# Patient Record
Sex: Female | Born: 1980 | Race: Black or African American | Hispanic: No | Marital: Single | Smoking: Never smoker
Health system: Southern US, Community
[De-identification: ages and names within clinical notes are randomized; demographics above are authoritative.]

## PROBLEM LIST (undated history)

## (undated) ENCOUNTER — Inpatient Hospital Stay (HOSPITAL_COMMUNITY): Payer: Self-pay

## (undated) DIAGNOSIS — Z87442 Personal history of urinary calculi: Secondary | ICD-10-CM

## (undated) DIAGNOSIS — I10 Essential (primary) hypertension: Secondary | ICD-10-CM

## (undated) DIAGNOSIS — E282 Polycystic ovarian syndrome: Secondary | ICD-10-CM

## (undated) DIAGNOSIS — K219 Gastro-esophageal reflux disease without esophagitis: Secondary | ICD-10-CM

## (undated) DIAGNOSIS — E119 Type 2 diabetes mellitus without complications: Secondary | ICD-10-CM

## (undated) DIAGNOSIS — N200 Calculus of kidney: Secondary | ICD-10-CM

## (undated) DIAGNOSIS — G4730 Sleep apnea, unspecified: Secondary | ICD-10-CM

---

## 2000-12-18 ENCOUNTER — Other Ambulatory Visit: Admission: RE | Admit: 2000-12-18 | Discharge: 2000-12-18 | Payer: Self-pay

## 2001-01-26 ENCOUNTER — Emergency Department (HOSPITAL_COMMUNITY): Admission: EM | Admit: 2001-01-26 | Discharge: 2001-01-26 | Payer: Self-pay | Admitting: *Deleted

## 2003-02-02 ENCOUNTER — Emergency Department (HOSPITAL_COMMUNITY): Admission: EM | Admit: 2003-02-02 | Discharge: 2003-02-02 | Payer: Self-pay | Admitting: Emergency Medicine

## 2004-01-24 ENCOUNTER — Emergency Department (HOSPITAL_COMMUNITY): Admission: EM | Admit: 2004-01-24 | Discharge: 2004-01-24 | Payer: Self-pay | Admitting: Emergency Medicine

## 2004-08-17 ENCOUNTER — Emergency Department (HOSPITAL_COMMUNITY): Admission: EM | Admit: 2004-08-17 | Discharge: 2004-08-18 | Payer: Self-pay | Admitting: *Deleted

## 2005-07-14 ENCOUNTER — Emergency Department (HOSPITAL_COMMUNITY): Admission: EM | Admit: 2005-07-14 | Discharge: 2005-07-15 | Payer: Self-pay | Admitting: Emergency Medicine

## 2006-06-18 ENCOUNTER — Emergency Department (HOSPITAL_COMMUNITY): Admission: EM | Admit: 2006-06-18 | Discharge: 2006-06-18 | Payer: Self-pay | Admitting: Emergency Medicine

## 2008-05-07 ENCOUNTER — Emergency Department (HOSPITAL_COMMUNITY): Admission: EM | Admit: 2008-05-07 | Discharge: 2008-05-07 | Payer: Self-pay | Admitting: Emergency Medicine

## 2008-06-03 ENCOUNTER — Emergency Department (HOSPITAL_COMMUNITY): Admission: EM | Admit: 2008-06-03 | Discharge: 2008-06-03 | Payer: Self-pay | Admitting: Emergency Medicine

## 2008-06-29 ENCOUNTER — Emergency Department (HOSPITAL_COMMUNITY): Admission: EM | Admit: 2008-06-29 | Discharge: 2008-06-29 | Payer: Self-pay | Admitting: Emergency Medicine

## 2011-05-30 LAB — RAPID STREP SCREEN (MED CTR MEBANE ONLY): Streptococcus, Group A Screen (Direct): NEGATIVE

## 2011-05-31 LAB — DIFFERENTIAL
Basophils Absolute: 0
Basophils Relative: 1
Eosinophils Absolute: 0.1
Eosinophils Relative: 2
Lymphocytes Relative: 28
Lymphs Abs: 1.6
Monocytes Absolute: 0.8
Monocytes Relative: 14 — ABNORMAL HIGH
Neutro Abs: 3.1
Neutrophils Relative %: 56

## 2011-05-31 LAB — POCT CARDIAC MARKERS
CKMB, poc: 4.1
CKMB, poc: 4.6
Myoglobin, poc: 213
Myoglobin, poc: 215
Troponin i, poc: <0.05
Troponin i, poc: <0.05

## 2011-05-31 LAB — BASIC METABOLIC PANEL
BUN: 12
CO2: 30
Calcium: 8.8
Chloride: 103
Creatinine, Ser: 0.87
GFR calc Af Amer: >60
GFR calc non Af Amer: >60
Glucose, Bld: 92
Potassium: 4.1
Sodium: 135

## 2011-05-31 LAB — CBC
HCT: 40.5
Hemoglobin: 13.6
MCHC: 33.6
MCV: 83.1
Platelets: 289
RBC: 4.88
RDW: 13.9
WBC: 5.6

## 2014-02-09 ENCOUNTER — Emergency Department (HOSPITAL_COMMUNITY): Payer: Self-pay

## 2014-02-09 ENCOUNTER — Encounter (HOSPITAL_COMMUNITY): Payer: Self-pay | Admitting: Emergency Medicine

## 2014-02-09 ENCOUNTER — Emergency Department (HOSPITAL_COMMUNITY)
Admission: EM | Admit: 2014-02-09 | Discharge: 2014-02-09 | Disposition: A | Discharge status: Home / Self Care | Payer: Self-pay | Attending: Emergency Medicine | Admitting: Emergency Medicine

## 2014-02-09 DIAGNOSIS — Z79899 Other long term (current) drug therapy: Secondary | ICD-10-CM | POA: Insufficient documentation

## 2014-02-09 DIAGNOSIS — I10 Essential (primary) hypertension: Secondary | ICD-10-CM | POA: Insufficient documentation

## 2014-02-09 DIAGNOSIS — M65849 Other synovitis and tenosynovitis, unspecified hand: Principal | ICD-10-CM

## 2014-02-09 DIAGNOSIS — M778 Other enthesopathies, not elsewhere classified: Secondary | ICD-10-CM

## 2014-02-09 DIAGNOSIS — M779 Enthesopathy, unspecified: Secondary | ICD-10-CM

## 2014-02-09 DIAGNOSIS — M65839 Other synovitis and tenosynovitis, unspecified forearm: Secondary | ICD-10-CM | POA: Insufficient documentation

## 2014-02-09 HISTORY — DX: Essential (primary) hypertension: I10

## 2014-02-09 MED ORDER — NAPROXEN 250 MG PO TABS
500.0000 mg | ORAL_TABLET | Freq: Once | ORAL | Status: AC
  Administered 2014-02-09: 500 mg via ORAL
  Filled 2014-02-09: qty 2

## 2014-02-09 MED ORDER — NAPROXEN SODIUM 550 MG PO TABS: ORAL_TABLET | ORAL | Status: DC

## 2014-02-09 MED ORDER — HYDROCODONE-ACETAMINOPHEN 5-325 MG PO TABS: 1.0000 | ORAL_TABLET | Freq: Four times a day (QID) | ORAL | Status: DC | PRN

## 2014-02-09 NOTE — ED Notes (Signed)
Pt reporting pain in left wrist, increased with movement.  Ice pack applied.

## 2014-02-09 NOTE — ED Provider Notes (Signed)
CSN: [id number]     Arrival date & time 02/09/14  2151 History   First MD Initiated Contact with Patient 02/09/14 2331     Chief Complaint  Patient presents with  . Hand Pain     (Consider location/radiation/quality/duration/timing/severity/associated sxs/prior Treatment) HPI This is a 33 year old female with several days of pain in her left wrist proximal to the thumb. She denies injury. Pain is moderate to severe and worse with movement of the thumb or with palpation. There is no associated swelling or deformity. There is no functional deficit or numbness.  Past Medical History  Diagnosis Date  . Hypertension    History reviewed. No pertinent past surgical history. No family history on file. History  Substance Use Topics  . Smoking status: Never Smoker   . Smokeless tobacco: Not on file  . Alcohol Use: No   OB History   Grav Para Term Preterm Abortions TAB SAB Ect Mult Living                 Review of Systems  All other systems reviewed and are negative.  Allergies  Review of patient's allergies indicates no known allergies.  Home Medications   Prior to Admission medications   Medication Sig Start Date End Date Taking? Authorizing Provider  carvedilol (COREG) 25 MG tablet Take 25 mg by mouth 2 (two) times daily with a meal.   Yes Historical Provider, MD  hydrochlorothiazide (HYDRODIURIL) 25 MG tablet Take 25 mg by mouth daily.   Yes Historical Provider, MD   BP 159/92  Pulse 82  Temp(Src) 97.6 F (36.4 C) (Oral)  Resp 20  Ht 5\' 7"  (1.702 m)  Wt 356 lb (161.481 kg)  BMI 55.74 kg/m2  SpO2 98%  LMP 01/09/2014  Physical Exam General: Well-developed, well-nourished female in no acute distress; appearance consistent with age of record HENT: normocephalic; atraumatic Eyes: pupils equal, round and reactive to light; extraocular muscles intact Neck: supple Heart: regular rate and rhythm Lungs: clear to auscultation bilaterally Abdomen: soft; obese;  nontender Extremities: No deformity; full range of motion; pulses normal; tenderness of the left extensor pollicis longus at the wrist without deformity or Neurologic: Awake, alert and oriented; motor function intact in all extremities and symmetric; no facial droop Skin: Warm and dry Psychiatric: Normal mood and affect    ED Course  Procedures (including critical care time)  MDM      Hanley SeamenJohn L Nieves Barberi, MD 02/09/14 2338

## 2014-02-09 NOTE — ED Notes (Signed)
Patient c/o left hand pain x 5 days; denies injury or trauma.  Patient states has a popping behind her thumb when she moves it.

## 2014-02-09 NOTE — Discharge Instructions (Signed)
Extensor Pollicis Longus Tendinitis Extensor pollicis longus (EPL) tendonitis is a condition in which the EPL tendon lining (sheath) becomes inflamed. This causes pain on the thumb side (radial side) of the back of the wrist. The EPL tendon attaches the EPL muscle to the bone. The EPL muscle straightens the thumb. The tendon lining secretes a lubricant that allows the EPL to glide smoothly. When the lining becomes inflamed, the tendon can not glide smoothly, which causes pain. There are three grades of EPL tendonitis. A grade 1 strain is a mild strain, where the tendon experiences a slight pull, but no obvious tearing (microscopic tearing). There is no loss of strength, and the tendon is the correct length. Grade 2 strains are a moderate strain. There is tearing of tendon fibers within the tendon or where the tendon meets the bone or muscle. Tearing of the tendon causes the length of the whole muscle-tendon-bone unit to increase. A grade 2 injury usually results in decreased strength. A grade 3 strain is a complete rupture of the tendon.  CAUSES   EPL tendinitis is often an overuse injury and caused by repeated motions of the hand and wrist, due to friction of the tendon within the lining.  Sudden increase in activity or change in activity. SYMPTOMS   Vague, diffuse pain and tenderness over the thumb side of the back of the wrist.  Pain that gets worse when straightening the thumb.  Locking, catching, or triggering of the thumb joint.  Limited motion of the thumb.  Little or no swelling, warmth, or redness of the back of the wrist.  Crackling sound (crepitation) when the tendon or wrist is moved or touched. RISK INCREASES WITH:  Sports that involve repeated hand and wrist motions (tennis, golf, bowling).  Previous wrist injury.  Heavy labor.  Poor wrist strength and flexibility.  Failure to warm up properly before activity. PREVENTION   Warm up and stretch properly before  activity.  Allow time for adequate rest and recovery between practices and competition.  Maintain physical fitness:  Forearm, wrist, and hand flexibility.  Muscle strength and endurance.  Learn and use proper exercise technique. PROGNOSIS  This condition is often curable within 6 weeks, if treated properly with non-surgical treatment and resting the affected area. Surgery may be advised. RELATED COMPLICATIONS   Longer healing time, if not properly treated, or if not given adequate time to heal.  Chronic inflammation of the EPL tendon, causing pain.  Recurring of symptoms, especially if activity is resumed too soon.  Risks of surgery: infection, bleeding, injury to nerves (numbness of the thumb), continued pain, incomplete release of the tendon lining, recurring symptoms, cutting of the tendon, thumb weakness, thumb stiffness, and inability to straighten the thumb. TREATMENT  Treatment first involves ice and medicine to reduce pain and inflammation. Stretching and strengthening exercises, along with activity modification, may be advised to reduce painful symptoms. For certain cases, a brace, splint, or taping may be advised, to reduce wrist movement during activity. Your caregiver may recommend a corticosteroid injection to reduce inflammation. If non-surgical treatment is unsuccessful, surgery may be advised to release the inflamed tendon. If the tendon is torn, it may require repair or replacement (reconstruction). MEDICATION   If pain medicine is needed, nonsteroidal anti-inflammatory medicines, (aspirin and ibuprofen), or other minor pain relievers (acetaminophen), are often advised.  Do not take pain medicine for 7 days before surgery.  Prescription pain relievers are usually only prescribed after surgery. Use only as directed and only  as much as you need.  Cortisone injections reduce inflammation. However, these are used only in extreme cases, because there is a limit to the  number of times they may be given. These injections may weaken muscle and tendon tissue. Anesthetics also temporarily relieve pain. COLD THERAPY  Cold treatment (icing) relieves pain and reduces inflammation. Cold treatment should be applied for 10 to 15 minutes every 2 to 3 hours, and immediately after activity that aggravates your symptoms. Use ice packs or an ice massage. SEEK MEDICAL CARE IF:   Symptoms get worse or do not improve in 2 to 4 weeks, despite treatment.  You experience pain, numbness, or coldness in the hand.  Blue, gray, or dark color appears in the fingernails.  Any of the following occur after surgery: increased pain, swelling, redness, drainage of fluids, bleeding in the affected area, or signs of infection.  New, unexplained symptoms develop. (Drugs used in treatment may produce side effects.) Document Released: 08/14/2005 Document Revised: 11/06/2011 Document Reviewed: 11/26/2008 [facility] Patient Information 2014 [place]ExitCare, [place]LLC.

## 2014-03-02 MED FILL — Oxycodone w/ Acetaminophen Tab 5-325 MG: ORAL | Qty: 6 | Status: AC

## 2014-09-10 ENCOUNTER — Emergency Department (HOSPITAL_COMMUNITY): Payer: Self-pay

## 2014-09-10 ENCOUNTER — Other Ambulatory Visit: Payer: Self-pay

## 2014-09-10 ENCOUNTER — Encounter (HOSPITAL_COMMUNITY): Payer: Self-pay | Admitting: Emergency Medicine

## 2014-09-10 ENCOUNTER — Emergency Department (HOSPITAL_COMMUNITY)
Admission: EM | Admit: 2014-09-10 | Discharge: 2014-09-10 | Disposition: A | Discharge status: Home / Self Care | Payer: Self-pay | Attending: Emergency Medicine | Admitting: Emergency Medicine

## 2014-09-10 DIAGNOSIS — K219 Gastro-esophageal reflux disease without esophagitis: Secondary | ICD-10-CM | POA: Insufficient documentation

## 2014-09-10 DIAGNOSIS — Z79899 Other long term (current) drug therapy: Secondary | ICD-10-CM | POA: Insufficient documentation

## 2014-09-10 DIAGNOSIS — R0981 Nasal congestion: Secondary | ICD-10-CM | POA: Insufficient documentation

## 2014-09-10 DIAGNOSIS — R059 Cough, unspecified: Secondary | ICD-10-CM

## 2014-09-10 DIAGNOSIS — R05 Cough: Secondary | ICD-10-CM

## 2014-09-10 DIAGNOSIS — R079 Chest pain, unspecified: Secondary | ICD-10-CM | POA: Insufficient documentation

## 2014-09-10 DIAGNOSIS — I10 Essential (primary) hypertension: Secondary | ICD-10-CM | POA: Insufficient documentation

## 2014-09-10 MED ORDER — PANTOPRAZOLE SODIUM 40 MG PO TBEC
40.0000 mg | DELAYED_RELEASE_TABLET | Freq: Every day | ORAL | Status: DC
  Administered 2014-09-10: 40 mg via ORAL
  Filled 2014-09-10: qty 1

## 2014-09-10 MED ORDER — FAMOTIDINE 20 MG PO TABS
20.0000 mg | ORAL_TABLET | Freq: Once | ORAL | Status: AC
  Administered 2014-09-10: 20 mg via ORAL
  Filled 2014-09-10: qty 1

## 2014-09-10 MED ORDER — SUCRALFATE 1 GM/10ML PO SUSP
1.0000 g | Freq: Once | ORAL | Status: AC
  Administered 2014-09-10: 1 g via ORAL
  Filled 2014-09-10: qty 10

## 2014-09-10 NOTE — ED Notes (Addendum)
Pt reports cough x3 days. Pt reports ever since cough developed pt reports "feels like something is stuck in my throat" and chest tightness. Airway patent.pt reports tightness is worse with sitting. [facility] "[facility]."

## 2014-09-10 NOTE — ED Provider Notes (Signed)
CSN: [id number]     Arrival date & time 09/10/14  1748 History   First MD Initiated Contact with Patient 09/10/14 1825     Chief Complaint  Patient presents with  . Cough     (Consider location/radiation/quality/duration/timing/severity/associated sxs/prior Treatment) Patient is a 34 y.o. female presenting with cough. The history is provided by the patient.  Cough Cough characteristics:  Non-productive Duration:  3 days Timing:  Intermittent Progression:  Unchanged Chronicity:  New Smoker: no   Context: weather changes   Context: not exposure to allergens and not sick contacts   Relieved by:  Nothing Worsened by:  Lying down Ineffective treatments:  None tried Associated symptoms: chest pain and sinus congestion   Associated symptoms: no eye discharge, no fever, no shortness of breath and no wheezing   Risk factors: no recent travel     Past Medical History  Diagnosis Date  . Hypertension    History reviewed. No pertinent past surgical history. History reviewed. No pertinent family history. History  Substance Use Topics  . Smoking status: Never Smoker   . Smokeless tobacco: Not on file  . Alcohol Use: No   OB History    No data available     Review of Systems  Constitutional: Negative for fever and activity change.       All ROS Neg except as noted in HPI  Eyes: Negative for photophobia and discharge.  Respiratory: Positive for cough. Negative for shortness of breath and wheezing.   Cardiovascular: Positive for chest pain. Negative for palpitations.  Gastrointestinal: Negative for abdominal pain and blood in stool.  Genitourinary: Negative for dysuria, frequency and hematuria.  Musculoskeletal: Negative for back pain, arthralgias and neck pain.  Skin: Negative.   Neurological: Negative for dizziness, seizures and speech difficulty.  Psychiatric/Behavioral: Negative for hallucinations and confusion.      Allergies  Review of patient's allergies indicates no  known allergies.  Home Medications   Prior to Admission medications   Medication Sig Start Date End Date Taking? Authorizing Provider  carvedilol (COREG) 25 MG tablet Take 25 mg by mouth 2 (two) times daily with a meal.    Historical Provider, MD  hydrochlorothiazide (HYDRODIURIL) 25 MG tablet Take 25 mg by mouth daily.    Historical Provider, MD  HYDROcodone-acetaminophen (NORCO) 5-325 MG per tablet Take 1-2 tablets by mouth every 6 (six) hours as needed (for pain). 02/09/14   Carlisle Beers Molpus, MD  naproxen sodium (ANAPROX DS) 550 MG tablet Take 1 tablet twice daily as needed for hand pain. Best taken with a meal. 02/09/14   John L Molpus, MD   BP 186/99 mmHg  Pulse 110  Temp(Src) 98.2 F (36.8 C) (Oral)  Resp 18  Ht  (1.702 m)  Wt 356 lb (161.481 kg)  BMI 55.74 kg/m2  SpO2 100%  LMP 08/17/2014 Physical Exam  Constitutional: She is oriented to person, place, and time. She appears well-developed and well-nourished.  Non-toxic appearance.  HENT:  Head: Normocephalic.  Right Ear: Tympanic membrane and external ear normal.  Left Ear: Tympanic membrane and external ear normal.  Mild nasal congestion.  Eyes: EOM and lids are normal. Pupils are equal, round, and reactive to light.  Neck: Normal range of motion. Neck supple. Carotid bruit is not present.  Cardiovascular: Normal rate, regular rhythm, normal heart sounds, intact distal pulses and normal pulses.   Pulmonary/Chest: Breath sounds normal. No respiratory distress. She has no wheezes. She exhibits tenderness.  Abdominal: Soft. Bowel sounds are  normal. There is no tenderness. There is no guarding.  Musculoskeletal: Normal range of motion.  Lymphadenopathy:       Head (right side): No submandibular adenopathy present.       Head (left side): No submandibular adenopathy present.    She has no cervical adenopathy.  Neurological: She is alert and oriented to person, place, and time. She has normal strength. No cranial nerve deficit  or sensory deficit.  Skin: Skin is warm and dry.  Psychiatric: She has a normal mood and affect. Her speech is normal.  Nursing note and vitals reviewed.   ED Course  Procedures (including critical care time) Labs Review Labs Reviewed - No data to display  Imaging Review No results found.   EKG Interpretation None      MDM ` Chest xray is negative for acute changes.   Pt speaking in complete sentences. Suspect pt has cough due to nasal congestion, and reflux. Pt to use nexium, pepcid, for discomfort. Pt to see GI or return to the ED if any chages or problem.    Final diagnoses:  Cough    **I have reviewed nursing notes, vital signs, and all appropriate lab and imaging results for this patient.Kathie Dike*    Shakela Donati M Antavious Spanos, PA-C 09/11/14 Jacinta [id number]

## 2014-09-10 NOTE — Discharge Instructions (Signed)
Your chest x-ray and electrocardiogram are well within normal limits. Your examination reveals some mild congestion nasally. Your history suggest gastroesophageal reflux disease. Please elevate the head of your bed on 6 inch blocks. Please do not eat or drink after 9:00 night. Please reduce if not eliminate spicy and fried foods. Please use Nexium (over-the-counter) daily, use Pepcid 20 mg daily. Please use Afrin spray every 8 hours for the next 5 days only for nasal congestion. Please see your primary physician, or return to the emergency department if any changes, problems, or concerns. Gastroesophageal Reflux Disease, Adult Gastroesophageal reflux disease (GERD) happens when acid from your stomach flows up into the esophagus. When acid comes in contact with the esophagus, the acid causes soreness (inflammation) in the esophagus. Over time, GERD may create small holes (ulcers) in the lining of the esophagus. CAUSES   Increased body weight. This puts pressure on the stomach, making acid rise from the stomach into the esophagus.  Smoking. This increases acid production in the stomach.  Drinking alcohol. This causes decreased pressure in the lower esophageal sphincter (valve or ring of muscle between the esophagus and stomach), allowing acid from the stomach into the esophagus.  Late evening meals and a full stomach. This increases pressure and acid production in the stomach.  A malformed lower esophageal sphincter. Sometimes, no cause is found. SYMPTOMS   Burning pain in the lower part of the mid-chest behind the breastbone and in the mid-stomach area. This may occur twice a week or more often.  Trouble swallowing.  Sore throat.  Dry cough.  Asthma-like symptoms including chest tightness, shortness of breath, or wheezing. DIAGNOSIS  Your caregiver may be able to diagnose GERD based on your symptoms. In some cases, X-rays and other tests may be done to check for complications or to check  the condition of your stomach and esophagus. TREATMENT  Your caregiver may recommend over-the-counter or prescription medicines to help decrease acid production. Ask your caregiver before starting or adding any new medicines.  HOME CARE INSTRUCTIONS   Change the factors that you can control. Ask your caregiver for guidance concerning weight loss, quitting smoking, and alcohol consumption.  Avoid foods and drinks that make your symptoms worse, such as:  Caffeine or alcoholic drinks.  Chocolate.  Peppermint or mint flavorings.  Garlic and onions.  Spicy foods.  Citrus fruits, such as oranges, lemons, or limes.  Tomato-based foods such as sauce, chili, salsa, and pizza.  Fried and fatty foods.  Avoid lying down for the 3 hours prior to your bedtime or prior to taking a nap.  Eat small, frequent meals instead of large meals.  Wear loose-fitting clothing. Do not wear anything tight around your waist that causes pressure on your stomach.  Raise the head of your bed 6 to 8 inches with wood blocks to help you sleep. Extra pillows will not help.  Only take over-the-counter or prescription medicines for pain, discomfort, or fever as directed by your caregiver.  Do not take aspirin, ibuprofen, or other nonsteroidal anti-inflammatory drugs (NSAIDs). SEEK IMMEDIATE MEDICAL CARE IF:   You have pain in your arms, neck, jaw, teeth, or back.  Your pain increases or changes in intensity or duration.  You develop nausea, vomiting, or sweating (diaphoresis).  You develop shortness of breath, or you faint.  Your vomit is green, yellow, black, or looks like coffee grounds or blood.  Your stool is red, bloody, or black. These symptoms could be signs of other problems, such as  heart disease, gastric bleeding, or esophageal bleeding. MAKE SURE YOU:   Understand these instructions.  Will watch your condition.  Will get help right away if you are not doing well or get worse. Document  Released: 05/24/2005 Document Revised: 11/06/2011 Document Reviewed: 03/03/2011 [facility] Patient Information 2015 [place], [place]LLC. This information is not intended to replace advice given to you by your health care provider. Make sure you discuss any questions you have with your health care provider.

## 2014-09-13 ENCOUNTER — Emergency Department (HOSPITAL_COMMUNITY)
Admission: EM | Admit: 2014-09-13 | Discharge: 2014-09-13 | Disposition: A | Discharge status: Home / Self Care | Payer: Self-pay | Attending: Emergency Medicine | Admitting: Emergency Medicine

## 2014-09-13 ENCOUNTER — Encounter (HOSPITAL_COMMUNITY): Payer: Self-pay | Admitting: Emergency Medicine

## 2014-09-13 DIAGNOSIS — Z7952 Long term (current) use of systemic steroids: Secondary | ICD-10-CM | POA: Insufficient documentation

## 2014-09-13 DIAGNOSIS — Z79899 Other long term (current) drug therapy: Secondary | ICD-10-CM | POA: Insufficient documentation

## 2014-09-13 DIAGNOSIS — I10 Essential (primary) hypertension: Secondary | ICD-10-CM | POA: Insufficient documentation

## 2014-09-13 DIAGNOSIS — J019 Acute sinusitis, unspecified: Secondary | ICD-10-CM | POA: Insufficient documentation

## 2014-09-13 DIAGNOSIS — R059 Cough, unspecified: Secondary | ICD-10-CM

## 2014-09-13 DIAGNOSIS — Z791 Long term (current) use of non-steroidal anti-inflammatories (NSAID): Secondary | ICD-10-CM | POA: Insufficient documentation

## 2014-09-13 DIAGNOSIS — R05 Cough: Secondary | ICD-10-CM

## 2014-09-13 MED ORDER — GUAIFENESIN-CODEINE 100-10 MG/5ML PO SOLN
10.0000 mL | Freq: Once | ORAL | Status: AC
  Administered 2014-09-13: 10 mL via ORAL
  Filled 2014-09-13: qty 10

## 2014-09-13 MED ORDER — PROMETHAZINE-DM 6.25-15 MG/5ML PO SYRP: 5.0000 mL | ORAL_SOLUTION | ORAL | Status: DC

## 2014-09-13 MED ORDER — ALBUTEROL SULFATE HFA 108 (90 BASE) MCG/ACT IN AERS
2.0000 | INHALATION_SPRAY | Freq: Once | RESPIRATORY_TRACT | Status: AC
  Administered 2014-09-13: 2 via RESPIRATORY_TRACT
  Filled 2014-09-13: qty 6.7

## 2014-09-13 NOTE — ED Provider Notes (Signed)
CSN: [id number]     Arrival date & time 09/13/14  1851 History  This chart was scribed for a non-physician practitioner, Pauline Ausammy Nillie Bartolotta, PA-C working with Vanetta MuldersScott Zackowski, MD by [place] Peace, ED Scribe. The patient was seen in [facility]. The patient's care was started at 7:06 PM.    Chief Complaint  Patient presents with  . Cough      Patient is a 34 y.o. female presenting with cough. The history is provided by the patient. No language interpreter was used.  Cough Cough characteristics:  Productive Severity:  Severe Duration:  1 week Timing:  Intermittent Progression:  Unchanged Chronicity:  New Smoker: no   Associated symptoms: no chest pain, no chills, no ear pain, no fever, no headaches, no rash, no shortness of breath, no sore throat and no wheezing   HPI Comments: Tammy Taylor is a 34 y.o. female who presents to the Emergency Department complaining of intermittent productive cough, sinus pressure and nasal congestion onset 1 week ago. Pt states she feels like her sinuses are "messed up". Cough worse at night when she is laying down. Pt was seen 3 days ago here at ED for same issue and states the medications given have not helped her symptoms. [facility] by a physician that prescribed her prednisone. She goes on to explain that she stopped taking the prescription after being advised to do so by the previous visit here. No complaints of fever, shortness of breath, abd pain, wheezing or chest pain. States she has been taking OTC sudafed with slight relief of nasal congestion  Past Medical History  Diagnosis Date  . Hypertension    History reviewed. No pertinent past surgical history. History reviewed. No pertinent family history. History  Substance Use Topics  . Smoking status: Never Smoker   . Smokeless tobacco: Not on file  . Alcohol Use: No   OB History    No data available     Review of Systems  Constitutional:  Negative for fever, chills and appetite change.  HENT: Positive for congestion and sinus pressure. Negative for ear pain, facial swelling, sore throat and trouble swallowing.   Respiratory: Positive for cough. Negative for chest tightness, shortness of breath and wheezing.   Cardiovascular: Negative for chest pain.  Gastrointestinal: Negative for vomiting, abdominal pain and diarrhea.  Genitourinary: Negative for dysuria and flank pain.  Musculoskeletal: Negative for arthralgias, neck pain and neck stiffness.  Skin: Negative for rash.  Neurological: Negative for dizziness, syncope, facial asymmetry, weakness, numbness and headaches.  Hematological: Negative for adenopathy.  All other systems reviewed and are negative.     Allergies  Review of patient's allergies indicates no known allergies.  Home Medications   Prior to Admission medications   Medication Sig Start Date End Date Taking? Authorizing Provider  HYDROcodone-acetaminophen (NORCO) 5-325 MG per tablet Take 1-2 tablets by mouth every 6 (six) hours as needed (for pain). Patient not taking: Reported on 09/10/2014 02/09/14   Carlisle BeersJohn L Molpus, MD  lisinopril (PRINIVIL,ZESTRIL) 10 MG tablet Take 10 mg by mouth daily.    Historical Provider, MD  naproxen sodium (ANAPROX DS) 550 MG tablet Take 1 tablet twice daily as needed for hand pain. Best taken with a meal. Patient not taking: Reported on 09/10/2014 02/09/14   Carlisle BeersJohn L Molpus, MD  predniSONE (DELTASONE) 20 MG tablet Take 60 mg by mouth daily with breakfast. 5 day course starting on 09/10/2014    Historical Provider, MD  BP 123/56 mmHg  Pulse 80  Temp(Src) 98.1 F (36.7 C) (Oral)  Resp 20  Ht  (1.702 m)  Wt 356 lb (161.481 kg)  BMI 55.74 kg/m2  SpO2 100%  LMP 08/17/2014 Physical Exam  Constitutional: She is oriented to person, place, and time. She appears well-developed and well-nourished. No distress.  HENT:  Head: Normocephalic and atraumatic.  Right Ear: Tympanic  membrane and ear canal normal.  Left Ear: Tympanic membrane normal.  Nose: Mucosal edema and rhinorrhea present. Right sinus exhibits maxillary sinus tenderness. Left sinus exhibits maxillary sinus tenderness.  Mouth/Throat: Uvula is midline and mucous membranes are normal. Mucous membranes are not dry. Posterior oropharyngeal erythema present. No oropharyngeal exudate or posterior oropharyngeal edema.  Bilateral nasal edema.   Eyes: Conjunctivae and EOM are normal.  Neck: Normal range of motion. Neck supple.  Cardiovascular: Normal rate, regular rhythm, normal heart sounds and intact distal pulses.   No murmur heard. Pulmonary/Chest: Effort normal and breath sounds normal. No respiratory distress. She has no wheezes. She has no rales. She exhibits no tenderness.  Musculoskeletal: Normal range of motion.  Lymphadenopathy:    She has no cervical adenopathy.  Neurological: She is alert and oriented to person, place, and time. She exhibits normal muscle tone. Coordination normal.  Skin: Skin is warm and dry.  Psychiatric: She has a normal mood and affect. Her behavior is normal.  Nursing note and vitals reviewed.   ED Course  Procedures (including critical care time) Labs Review Labs Reviewed - No data to display  Imaging Review No results found.   EKG Interpretation None     Medications - No data to display  7:10 PM- Treatment plan was discussed with patient who verbalizes understanding and agrees.    Reviewed CXR from 08/31/14 that showed no acute CP process MDM   Final diagnoses:  Cough  Acute sinusitis, recurrence not specified, unspecified location    Pt is well appearing, VSS.  No hypoxia, tachycardia or tachypnea.  Sx's likely related to viral process.  I have advised pt to use OTC afrin nasal spray x 3 days then d/c.  Albuterol inhaler dispensed and prescribed phenergan/DM cough syrup.  Pt reported taking OTC sudafed and has hx of HTN, so I have advised her to d/c use.     I personally performed the services described in this documentation, which was scribed in my presence. The recorded information has been reviewed and is accurate.   Aldrin Engelhard L. Trisha Mangle, PA-C 09/13/14 2205  Vanetta Mulders, MD 09/16/14 [phone]

## 2014-09-13 NOTE — ED Notes (Signed)
Pt reports nasal congestion,cough x1 week. nad noted.

## 2014-09-13 NOTE — Discharge Instructions (Signed)
Cough, Adult   A cough is a reflex. It helps you clear your throat and airways. A cough can help heal your body. A cough can last 2 or 3 weeks (acute) or may last more than 8 weeks (chronic). Some common causes of a cough can include an infection, allergy, or a cold.  HOME CARE  · Only take medicine as told by your doctor.  · If given, take your medicines (antibiotics) as told. Finish them even if you start to feel better.  · Use a cold steam vaporizer or humidifier in your home. This can help loosen thick spit (secretions).  · Sleep so you are almost sitting up (semi-upright). Use pillows to do this. This helps reduce coughing.  · Rest as needed.  · Stop smoking if you smoke.  GET HELP RIGHT AWAY IF:  · You have yellowish-white fluid (pus) in your thick spit.  · Your cough gets worse.  · Your medicine does not reduce coughing, and you are losing sleep.  · You cough up blood.  · You have trouble breathing.  · Your pain gets worse and medicine does not help.  · You have a fever.  MAKE SURE YOU:   · Understand these instructions.  · Will watch your condition.  · Will get help right away if you are not doing well or get worse.  Document Released: 04/27/2011 Document Revised: 12/29/2013 Document Reviewed: 04/27/2011  Exit[employer]® Patient Information ©2015 [employer]. This information is not intended to replace advice given to you by your health care provider. Make sure you discuss any questions you have with your health care provider.

## 2016-05-29 ENCOUNTER — Ambulatory Visit: Payer: Self-pay | Admitting: Family Medicine

## 2016-06-05 ENCOUNTER — Encounter: Payer: Self-pay | Admitting: Family Medicine

## 2017-05-13 ENCOUNTER — Inpatient Hospital Stay (HOSPITAL_COMMUNITY): Payer: Medicaid Other

## 2017-05-13 ENCOUNTER — Encounter (HOSPITAL_COMMUNITY): Payer: Self-pay | Admitting: *Deleted

## 2017-05-13 ENCOUNTER — Inpatient Hospital Stay (HOSPITAL_COMMUNITY)
Admission: AD | Admit: 2017-05-13 | Discharge: 2017-05-13 | Disposition: A | Discharge status: Home / Self Care | Payer: Medicaid Other | Source: Ambulatory Visit | Attending: Obstetrics & Gynecology | Admitting: Obstetrics & Gynecology

## 2017-05-13 DIAGNOSIS — O99211 Obesity complicating pregnancy, first trimester: Secondary | ICD-10-CM | POA: Insufficient documentation

## 2017-05-13 DIAGNOSIS — Z79899 Other long term (current) drug therapy: Secondary | ICD-10-CM | POA: Insufficient documentation

## 2017-05-13 DIAGNOSIS — Z87442 Personal history of urinary calculi: Secondary | ICD-10-CM | POA: Diagnosis not present

## 2017-05-13 DIAGNOSIS — O26891 Other specified pregnancy related conditions, first trimester: Secondary | ICD-10-CM | POA: Diagnosis not present

## 2017-05-13 DIAGNOSIS — E282 Polycystic ovarian syndrome: Secondary | ICD-10-CM | POA: Insufficient documentation

## 2017-05-13 DIAGNOSIS — O09521 Supervision of elderly multigravida, first trimester: Secondary | ICD-10-CM

## 2017-05-13 DIAGNOSIS — Z809 Family history of malignant neoplasm, unspecified: Secondary | ICD-10-CM | POA: Insufficient documentation

## 2017-05-13 DIAGNOSIS — O219 Vomiting of pregnancy, unspecified: Secondary | ICD-10-CM | POA: Insufficient documentation

## 2017-05-13 DIAGNOSIS — O99281 Endocrine, nutritional and metabolic diseases complicating pregnancy, first trimester: Secondary | ICD-10-CM | POA: Insufficient documentation

## 2017-05-13 DIAGNOSIS — O26899 Other specified pregnancy related conditions, unspecified trimester: Secondary | ICD-10-CM

## 2017-05-13 DIAGNOSIS — Z833 Family history of diabetes mellitus: Secondary | ICD-10-CM | POA: Diagnosis not present

## 2017-05-13 DIAGNOSIS — O10011 Pre-existing essential hypertension complicating pregnancy, first trimester: Secondary | ICD-10-CM | POA: Diagnosis present

## 2017-05-13 DIAGNOSIS — Z3A01 Less than 8 weeks gestation of pregnancy: Secondary | ICD-10-CM | POA: Insufficient documentation

## 2017-05-13 DIAGNOSIS — Z3A09 9 weeks gestation of pregnancy: Secondary | ICD-10-CM

## 2017-05-13 DIAGNOSIS — O161 Unspecified maternal hypertension, first trimester: Secondary | ICD-10-CM | POA: Diagnosis not present

## 2017-05-13 DIAGNOSIS — R109 Unspecified abdominal pain: Secondary | ICD-10-CM

## 2017-05-13 HISTORY — DX: Polycystic ovarian syndrome: E28.2

## 2017-05-13 HISTORY — DX: Calculus of kidney: N20.0

## 2017-05-13 LAB — URINALYSIS, ROUTINE W REFLEX MICROSCOPIC
BILIRUBIN URINE: NEGATIVE
Glucose, UA: NEGATIVE mg/dL
HGB URINE DIPSTICK: NEGATIVE
Ketones, ur: NEGATIVE mg/dL
Leukocytes, UA: NEGATIVE
Nitrite: NEGATIVE
Protein, ur: NEGATIVE mg/dL
Specific Gravity, Urine: 1.016 (ref 1.005–1.030)
pH: 6 (ref 5.0–8.0)

## 2017-05-13 LAB — CBC
HEMATOCRIT: 37.8 % (ref 36.0–46.0)
Hemoglobin: 12.4 g/dL (ref 12.0–15.0)
MCH: 25.4 pg — ABNORMAL LOW (ref 26.0–34.0)
MCHC: 32.8 g/dL (ref 30.0–36.0)
MCV: 77.5 fL — ABNORMAL LOW (ref 78.0–100.0)
Platelets: 294 10*3/uL (ref 150–400)
RBC: 4.88 MIL/uL (ref 3.87–5.11)
RDW: 15 % (ref 11.5–15.5)
WBC: 7.5 10*3/uL (ref 4.0–10.5)

## 2017-05-13 LAB — POCT PREGNANCY, URINE: Preg Test, Ur: POSITIVE — AB

## 2017-05-13 LAB — HCG, QUANTITATIVE, PREGNANCY: hCG, Beta Chain, Quant, S: 32375 m[IU]/mL — ABNORMAL HIGH (ref <5)

## 2017-05-13 MED ORDER — LABETALOL HCL 100 MG PO TABS: 200.0000 mg | ORAL_TABLET | Freq: Two times a day (BID) | ORAL | Status: DC

## 2017-05-13 MED ORDER — PRENATAL PLUS 27-1 MG PO TABS: 1.0000 | ORAL_TABLET | Freq: Every day | ORAL | 0 refills | Status: DC

## 2017-05-13 MED ORDER — LABETALOL HCL 200 MG PO TABS: 200.0000 mg | ORAL_TABLET | Freq: Two times a day (BID) | ORAL | 2 refills | Status: DC

## 2017-05-13 NOTE — MAU Note (Addendum)
Took 2 preg tests on Friday, both were positive. This will be her first preg.  Has been severely constipated (small this morning, prior was 3 or 4 days).  Having some pain in RLQ started yesterday.  Spotted a wk ago

## 2017-05-13 NOTE — MAU Provider Note (Signed)
History     CSN: [id number]  Arrival date and time: 05/13/17 1243   First Provider Initiated Contact with Patient 05/13/17 1457      Chief Complaint  Patient presents with  . Abdominal Pain  . Possible Pregnancy   Tammy Taylor is a 36 y.o. G1P0 with CHTN and no PNC at [redacted]w[redacted]d by LMP presenting for pregnancy confirmation and reporting intermittent mild pinching right lower quadrant and right groin pain for the past few days. She had a negative HPT 1 month ago and her first positive was 2 days ago. She's had nausea and occasional vomiting for 1 week. Pregnancy is desired.   NPC  Past Medical History:  Diagnosis Date  . Hypertension   . Kidney stones   . PCOS (polycystic ovarian syndrome)     History reviewed. No pertinent surgical history.  Family History  Problem Relation Age of Onset  . Diabetes Father   . Cancer Paternal Aunt     Social History  Substance Use Topics  . Smoking status: Never Smoker  . Smokeless tobacco: Never Used  . Alcohol use No    Allergies: No Known Allergies  Prescriptions Prior to Admission  Medication Sig Dispense Refill Last Dose  . carvedilol (COREG) 3.125 MG tablet Take 3.125 mg by mouth 2 (two) times daily with a meal.   05/12/2017 at Unknown time  . chlorthalidone (HYGROTON) 25 MG tablet Take 25 mg by mouth daily.   05/12/2017 at Unknown time    Review of Systems  Constitutional: Negative for activity change, appetite change, fatigue and fever.  Respiratory: Negative for chest tightness.   Cardiovascular: Negative for chest pain and leg swelling.  Gastrointestinal: Positive for abdominal pain.       Indicates diffuse right lower quadrant and right groin as area of intermittent pain  Genitourinary: Positive for frequency and vaginal bleeding. Negative for dysuria, hematuria, pelvic pain and urgency.        Gantz spot of pink blood on 05/07/2017, no other bleeding episode  Neurological: Negative for dizziness.   Psychiatric/Behavioral: Negative for agitation. The patient is not nervous/anxious.    Physical Exam   Blood pressure (!) 187/97, pulse 79, temperature 98 F (36.7 C), temperature source Oral, resp. rate 18, weight (!) 341 lb 12 oz (155 kg), last menstrual period 03/06/2017, SpO2 99 %.  Physical Exam  Nursing note and vitals reviewed. Constitutional: She is oriented to person, place, and time. She appears well-developed.  HENT:  Head: Normocephalic.  Eyes: Pupils are equal, round, and reactive to light.  Neck: Normal range of motion.  Cardiovascular: Normal rate.   Respiratory: Effort normal.  GI: Soft. There is no tenderness.  Musculoskeletal: Normal range of motion.  Neurological: She is alert and oriented to person, place, and time.  Skin: Skin is warm.    MAU Course  Procedures  Bedside US by me: unable to see cardiac activity US Ob Comp Less 14 Wks  Result Date: 05/13/2017 CLINICAL DATA:  Right lower quadrant abdominal pain. Pregnant patient. EXAM: OBSTETRIC <14 WK [place] AND TRANSVAGINAL OB US TECHNIQUE: Both transabdominal and transvaginal ultrasound examinations were performed for complete evaluation of the gestation as well as the maternal uterus, adnexal regions, and pelvic cul-de-sac. Transvaginal technique was performed to assess early pregnancy. COMPARISON:  None. FINDINGS: Intrauterine gestational sac: Single Yolk sac:  Visualized. Embryo:  Visualized. Cardiac Activity: Visualized. Heart Rate: 114 bpm MSD:   mm    w     d CRL:  5  mm   6 w   1 d                  [place] [facility]: Jan 05, 2018 Subchorionic hemorrhage:  None visualized. Maternal uterus/adnexae: Normal in appearance. Trace fluid in the pelvis is likely physiologic. IMPRESSION: 1. Single live IUP.  Trace physiologic fluid in the pelvis. Electronically Signed   By: Gerome Sam III M.D   On: 05/13/2017 15:56   US Ob Transvaginal  Result Date: 05/13/2017 CLINICAL DATA:  Right lower quadrant abdominal pain. Pregnant  patient. EXAM: OBSTETRIC <14 WK [place] AND TRANSVAGINAL OB US TECHNIQUE: Both transabdominal and transvaginal ultrasound examinations were performed for complete evaluation of the gestation as well as the maternal uterus, adnexal regions, and pelvic cul-de-sac. Transvaginal technique was performed to assess early pregnancy. COMPARISON:  None. FINDINGS: Intrauterine gestational sac: Single Yolk sac:  Visualized. Embryo:  Visualized. Cardiac Activity: Visualized. Heart Rate: 114 bpm MSD:   mm    w     d CRL:  5  mm   6 w   1 d                  [place] [facility]: Jan 05, 2018 Subchorionic hemorrhage:  None visualized. Maternal uterus/adnexae: Normal in appearance. Trace fluid in the pelvis is likely physiologic. IMPRESSION: 1. Single live IUP.  Trace physiologic fluid in the pelvis. Electronically Signed   By: Gerome Sam III M.D   On: 05/13/2017 15:56   C/W Dr. Erin Fulling will start labetalol 400 mg by mouth twice a day and stop her other antihypertensives   Assessment and Plan   1. Abdominal pain during pregnancy   2. Morbid obesity (HCC)   3. Essential hypertension affecting pregnancy in first trimester   4. AMA (advanced maternal age) multigravida 35+, first trimester    Allergies as of 05/13/2017   No Known Allergies     Medication List    STOP taking these medications   carvedilol 3.125 MG tablet Commonly known as:  COREG   chlorthalidone 25 MG tablet Commonly known as:  HYGROTON     TAKE these medications   labetalol 200 MG tablet Commonly known as:  NORMODYNE Take 1 tablet (200 mg total) by mouth 2 (two) times daily.   prenatal vitamin w/FE, FA 27-1 MG Tabs tablet Take 1 tablet by mouth daily.            Discharge Care Instructions        Start     Ordered   05/13/17 0000  prenatal vitamin w/FE, FA (PRENATAL 1 + 1) 27-1 MG TABS tablet  Daily    Question:  Supervising Provider  Answer:  Willodean Rosenthal   05/13/17 1641   05/13/17 0000  labetalol (NORMODYNE) 200 MG  tablet  2 times daily    Question:  Supervising Provider  Answer:  Willodean Rosenthal   05/13/17 1643    ; Follow-up Information    [facility]. Schedule an appointment as soon as possible for a visit in 2 week(s).   Contact information: [address] [place] [address] [phone]          Adlai Nieblas CNM 05/13/2017, 3:00 PM

## 2017-05-13 NOTE — MAU Note (Signed)
Patient declines cultures at this time. CNM in department and made aware.

## 2017-05-13 NOTE — MAU Note (Addendum)
Has not taken BP or "fluid pill"  today; last took yesterday.

## 2017-05-13 NOTE — Discharge Instructions (Signed)
First Trimester of Pregnancy The first trimester of pregnancy is from week 1 until the end of week 13 (months 1 through 3). A week after a sperm fertilizes an egg, the egg will implant on the wall of the uterus. This embryo will begin to develop into a baby. Genes from you and your partner will form the baby. The female genes will determine whether the baby will be a boy or a girl. At 6-8 weeks, the eyes and face will be formed, and the heartbeat can be seen on ultrasound. At the end of 12 weeks, all the baby's organs will be formed. Now that you are pregnant, you will want to do everything you can to have a healthy baby. Two of the most important things are to get good prenatal care and to follow your health care provider's instructions. Prenatal care is all the medical care you receive before the baby's birth. This care will help prevent, find, and treat any problems during the pregnancy and childbirth. Body changes during your first trimester Your body goes through many changes during pregnancy. The changes vary from woman to woman.  You may gain or lose a couple of pounds at first.  You may feel sick to your stomach (nauseous) and you may throw up (vomit). If the vomiting is uncontrollable, call your health care provider.  You may tire easily.  You may develop headaches that can be relieved by medicines. All medicines should be approved by your health care provider.  You may urinate more often. Painful urination may mean you have a bladder infection.  You may develop heartburn as a result of your pregnancy.  You may develop constipation because certain hormones are causing the muscles that push stool through your intestines to slow down.  You may develop hemorrhoids or swollen veins (varicose veins).  Your breasts may begin to grow larger and become tender. Your nipples may stick out more, and the tissue that surrounds them (areola) may become darker.  Your gums may bleed and may be  sensitive to brushing and flossing.  Dark spots or blotches (chloasma, mask of pregnancy) may develop on your face. This will likely fade after the baby is born.  Your menstrual periods will stop.  You may have a loss of appetite.  You may develop cravings for certain kinds of food.  You may have changes in your emotions from day to day, such as being excited to be pregnant or being concerned that something may go wrong with the pregnancy and baby.  You may have more vivid and strange dreams.  You may have changes in your hair. These can include thickening of your hair, rapid growth, and changes in texture. Some women also have hair loss during or after pregnancy, or hair that feels dry or thin. Your hair will most likely return to normal after your baby is born.  What to expect at prenatal visits During a routine prenatal visit:  You will be weighed to make sure you and the baby are growing normally.  Your blood pressure will be taken.  Your abdomen will be measured to track your baby's growth.  The fetal heartbeat will be listened to between weeks 10 and 14 of your pregnancy.  Test results from any previous visits will be discussed.  Your health care provider may ask you:  How you are feeling.  If you are feeling the baby move.  If you have had any abnormal symptoms, such as leaking fluid, bleeding, severe headaches,   or abdominal cramping.  If you are using any tobacco products, including cigarettes, chewing tobacco, and electronic cigarettes.  If you have any questions.  Other tests that may be performed during your first trimester include:  Blood tests to find your blood type and to check for the presence of any previous infections. The tests will also be used to check for low iron levels (anemia) and protein on red blood cells (Rh antibodies). Depending on your risk factors, or if you previously had diabetes during pregnancy, you may have tests to check for high blood  sugar that affects pregnant women (gestational diabetes).  Urine tests to check for infections, diabetes, or protein in the urine.  An ultrasound to confirm the proper growth and development of the baby.  Fetal screens for spinal cord problems (spina bifida) and Down syndrome.  HIV (human immunodeficiency virus) testing. Routine prenatal testing includes screening for HIV, unless you choose not to have this test.  You may need other tests to make sure you and the baby are doing well.  Follow these instructions at home: Medicines  Follow your health care provider's instructions regarding medicine use. Specific medicines may be either safe or unsafe to take during pregnancy.  Take a prenatal vitamin that contains at least 600 micrograms (mcg) of folic acid.  If you develop constipation, try taking a stool softener if your health care provider approves. Eating and drinking  Eat a balanced diet that includes fresh fruits and vegetables, whole grains, good sources of protein such as meat, eggs, or tofu, and low-fat dairy. Your health care provider will help you determine the amount of weight gain that is right for you.  Avoid raw meat and uncooked cheese. These carry germs that can cause birth defects in the baby.  Eating four or five small meals rather than three large meals a day may help relieve nausea and vomiting. If you start to feel nauseous, eating a few soda crackers can be helpful. Drinking liquids between meals, instead of during meals, also seems to help ease nausea and vomiting.  Limit foods that are high in fat and processed sugars, such as fried and sweet foods.  To prevent constipation: ? Eat foods that are high in fiber, such as fresh fruits and vegetables, whole grains, and beans. ? Drink enough fluid to keep your urine clear or pale yellow. Activity  Exercise only as directed by your health care provider. Most women can continue their usual exercise routine during  pregnancy. Try to exercise for 30 minutes at least 5 days a week. Exercising will help you: ? Control your weight. ? Stay in shape. ? Be prepared for labor and delivery.  Experiencing pain or cramping in the lower abdomen or lower back is a good sign that you should stop exercising. Check with your health care provider before continuing with normal exercises.  Try to avoid standing for long periods of time. Move your legs often if you must stand in one place for a long time.  Avoid heavy lifting.  Wear low-heeled shoes and practice good posture.  You may continue to have sex unless your health care provider tells you not to. Relieving pain and discomfort  Wear a good support bra to relieve breast tenderness.  Take warm sitz baths to soothe any pain or discomfort caused by hemorrhoids. Use hemorrhoid cream if your health care provider approves.  Rest with your legs elevated if you have leg cramps or low back pain.  If you develop   varicose veins in your legs, wear support hose. Elevate your feet for 15 minutes, 3-4 times a day. Limit salt in your diet. Prenatal care  Schedule your prenatal visits by the twelfth week of pregnancy. They are usually scheduled monthly at first, then more often in the last 2 months before delivery.  Write down your questions. Take them to your prenatal visits.  Keep all your prenatal visits as told by your health care provider. This is important. Safety  Wear your seat belt at all times when driving.  Make a list of emergency phone numbers, including numbers for family, friends, the hospital, and police and fire departments. General instructions  Ask your health care provider for a referral to a local prenatal education class. Begin classes no later than the beginning of month 6 of your pregnancy.  Ask for help if you have counseling or nutritional needs during pregnancy. Your health care provider can offer advice or refer you to specialists for help  with various needs.  Do not use hot tubs, steam rooms, or saunas.  Do not douche or use tampons or scented sanitary pads.  Do not cross your legs for long periods of time.  Avoid cat litter boxes and soil used by cats. These carry germs that can cause birth defects in the baby and possibly loss of the fetus by miscarriage or stillbirth.  Avoid all smoking, herbs, alcohol, and medicines not prescribed by your health care provider. Chemicals in these products affect the formation and growth of the baby.  Do not use any products that contain nicotine or tobacco, such as cigarettes and e-cigarettes. If you need help quitting, ask your health care provider. You may receive counseling support and other resources to help you quit.  Schedule a dentist appointment. At home, brush your teeth with a soft toothbrush and be gentle when you floss. Contact a health care provider if:  You have dizziness.  You have mild pelvic cramps, pelvic pressure, or nagging pain in the abdominal area.  You have persistent nausea, vomiting, or diarrhea.  You have a bad smelling vaginal discharge.  You have pain when you urinate.  You notice increased swelling in your face, hands, legs, or ankles.  You are exposed to fifth disease or chickenpox.  You are exposed to German measles (rubella) and have never had it. Get help right away if:  You have a fever.  You are leaking fluid from your vagina.  You have spotting or bleeding from your vagina.  You have severe abdominal cramping or pain.  You have rapid weight gain or loss.  You vomit blood or material that looks like coffee grounds.  You develop a severe headache.  You have shortness of breath.  You have any kind of trauma, such as from a fall or a car accident. Summary  The first trimester of pregnancy is from week 1 until the end of week 13 (months 1 through 3).  Your body goes through many changes during pregnancy. The changes vary from  woman to woman.  You will have routine prenatal visits. During those visits, your health care provider will examine you, discuss any test results you may have, and talk with you about how you are feeling. This information is not intended to replace advice given to you by your health care provider. Make sure you discuss any questions you have with your health care provider. Document Released: 08/08/2001 Document Revised: 07/26/2016 Document Reviewed: 07/26/2016 [employer] Interactive Patient Education  2017 [employer]  

## 2017-07-25 ENCOUNTER — Other Ambulatory Visit (HOSPITAL_COMMUNITY): Payer: Self-pay | Admitting: Unknown Physician Specialty

## 2017-07-26 ENCOUNTER — Encounter (HOSPITAL_COMMUNITY): Payer: Self-pay | Admitting: Unknown Physician Specialty

## 2017-08-02 ENCOUNTER — Other Ambulatory Visit (HOSPITAL_COMMUNITY): Payer: Self-pay | Admitting: *Deleted

## 2017-08-02 ENCOUNTER — Encounter (HOSPITAL_COMMUNITY): Payer: Self-pay

## 2017-08-02 ENCOUNTER — Ambulatory Visit (HOSPITAL_COMMUNITY)
Admission: RE | Admit: 2017-08-02 | Discharge: 2017-08-02 | Disposition: A | Discharge status: Home / Self Care | Payer: Medicaid Other | Source: Ambulatory Visit | Attending: Unknown Physician Specialty | Admitting: Unknown Physician Specialty

## 2017-08-02 DIAGNOSIS — E669 Obesity, unspecified: Secondary | ICD-10-CM | POA: Diagnosis not present

## 2017-08-02 DIAGNOSIS — O169 Unspecified maternal hypertension, unspecified trimester: Secondary | ICD-10-CM

## 2017-08-02 DIAGNOSIS — O10012 Pre-existing essential hypertension complicating pregnancy, second trimester: Secondary | ICD-10-CM | POA: Insufficient documentation

## 2017-08-02 DIAGNOSIS — O99212 Obesity complicating pregnancy, second trimester: Secondary | ICD-10-CM | POA: Diagnosis not present

## 2017-08-02 DIAGNOSIS — Z3A18 18 weeks gestation of pregnancy: Secondary | ICD-10-CM | POA: Diagnosis not present

## 2017-08-02 NOTE — Consult Note (Signed)
MFM Consult, staff note: ? 1. Chronic hypertension (CHTN): During our discussion, I reviewed hypertension as a cause of uteroplacental insufficiency, with increased risk of IUGR, oligohydramnios, and stillbirth. I told her that her hypertension also places her at increased risk for preeclampsia, describing the triad of increased blood pressure, proteinuria, and abnormal edema. Lastly, hypertension (severe range) increases the risk of placental abruption, especially in the setting of superimposed preeclampsia.  I reviewed the essential tenets in the most recent guidelines for management of hypertension in pregnancy in accordance with the American College of Obstetrics and Gynecology expert opinion. We talked about the medical treatment of hypertension in pregnancy. I outlined the different classes of medications, emphasizing that angiotensin enzyme inhibitors and angoitensin receptor blockers are contraindicated, and diuretics are relatively contraindicated. I told her that beta-blockers and calcium channel blockers are commonly used to treat hypertension in pregnancy, and that both are felt to be safe for use in pregnancy.  ? Her dose of labetalol adequate in current maintainance of her blood pressure in pregnancy as evidenced by BP's of   Vitals:   08/02/17 1315  BP: (!) 144/88  Pulse: 79    today.   I outlined the usual plan of management for hypertension in pregnancy. She should have her blood pressure carefully followed, and her medications adjusted to keep her BP in the target range of around 130-159/70-109 mm/Hg; ie, HTN should not be treated (no medication adjustment) until 160/110 or greater measurements for blood pressures to be consistent with current ACOG/SMFM guidelines (ie, guidelines are 160/110 in absence of renal/cardiac disease during pregnancy).  Should she require further increases, I would strongly consider making the next adjustment to be labetolol 800 mg po TID (current dose is  900mg  po BID or 1800 mg total).  Once she maximizes on labetalol, I would recommend adding nifedipine at that point and adjusting as needed.  At the point of adding a second agent, it may be appropriate depending on your comfort level to transfer to MFM.  I feel this patient is very high risk for superimposed and severe preeclampsia. ? 2. Obesity: risk to pregnancy increases as the age and BMI of the patient increases. Risks inherent to such pregnancies include, but are not limited to: miscarriage, intrauterine fetal demise, gestational diabetes, preeclampsia, and gestational hypertension. Accordingly, the [facility] and ACOG guidelines are aimed at reducing complications during pregnancy in women with increased prepregnancy BMI.  ? Currently, I maintain that close surveillance for early detection of preeclampsia is necessary and closely monitor maternal blood pressure and renal function. Due to increased risk for preeclampsia, I recommend an early 24 hour urine collection for baseline renal function. The patient indicated that she turned one into your office this morning so those results should be available to you shortly as a baseline reference. ? Because there is increased risk for gestational diabetes as well as occult or previously undiagnosed pregestational diabetes, I recommend a screening HgbA1c to assess background insulin resistance prior to exposing her to a 1 hour GTT. Provided HgbA1c is less than 6.2%, I would recommend to arrange for early 1 hour GTT to screen for evidence of diabetes.  If negative, I would still reassess at 28 weeks.  There is a potential for inadequate ultrasound screening for fetal anomalies and fetal well-being due to the obesity thus making it difficult to diagnose fetal abnormalities and or monitor the pregnancy.  While the patient was expecting an ultrasound today, she was not scheduled for this examination (not  requested by your office) and our unit was overbooked so I was  told we would have to accommodate her on another day over the course of the next few days.  Lastly, there is an increased need for induction of labor, increased labor dystocia, increased incidents of larger gestational age neonates, increased risk for cesarean delivery and postpartum wound complications.  ? 3. Given her risk for preeclampsia, I recommended that she take ASA 81mg  po qd now and continue through 37 weeks.  I explained the syndrome of preeclampsia as being unique to pregnancy and the associated clinical triad of increased blood pressure, proteinuria and abnormal edema. I also reviewed the underlying pathophysiology of preeclampsia as being rooted in endothelial dysfunction. I explained to her the increased vascular reactivity as well as the leaky endothelial lining of the blood vessels that create an environment of uteroplacental insufficiency with increased risk of intrauterine growth restriction, oligohydramnios and stillbirth.   ? All of your patient's questions were answered. While this was a lot of information for her to digest, she seemed to have an adequate understanding.  ? Summary of recommendations: 1. Completion of anatomy in <1week with interval growth q4 weeks thereafter 2. 11-20 lb gestational weight gain (per IOM guidelines) 3. ASA 81 mg po qd, discontinue at 37 weeks 4. Continue labetalol 900mg  po bid as prescribed. Only adjust/increase in the event of severe hypertension.  With the next adjustment, I would simply increase to 800mg  po TID. 5. Antenatal testing at 32 weeks 6. Delivery at ~38 weeks if not indicated sooner owing to morbid obesity with [facility] requiring a multiple adjustments early in the pregnancy.   7. Should she develop severe preeclampsia prior to 34 weeks, I would [facility].   ? Time Spent: I spent in excess of 60 minutes in consultation with this patient to review records, evaluate her  case, and provide her with an adequate discussion and education. More than 50% of this time was spent in direct face-to-face counseling. ? It was a pleasure seeing your patient in the office today. Thank you for consultation. Please do not hesitate to contact our service for any further questions.  Thank you, ? Louann SjogrenJeffrey Morgan Tiffney Haughton  ? Rogelia Bogaenney, Ryman Rathgeber Morgan, MD, MS, FACOG Assistant Professor Section of Maternal-Fetal Medicine [facility]

## 2017-08-03 ENCOUNTER — Encounter (HOSPITAL_COMMUNITY): Payer: Self-pay

## 2017-08-03 ENCOUNTER — Other Ambulatory Visit (HOSPITAL_COMMUNITY): Payer: Self-pay

## 2017-08-09 ENCOUNTER — Encounter (HOSPITAL_COMMUNITY): Payer: Self-pay

## 2017-08-09 ENCOUNTER — Ambulatory Visit (HOSPITAL_COMMUNITY)
Admission: RE | Admit: 2017-08-09 | Discharge: 2017-08-09 | Disposition: A | Discharge status: Home / Self Care | Payer: Medicaid Other | Source: Ambulatory Visit | Attending: Unknown Physician Specialty | Admitting: Unknown Physician Specialty

## 2017-08-09 DIAGNOSIS — Z3A18 18 weeks gestation of pregnancy: Secondary | ICD-10-CM | POA: Diagnosis not present

## 2017-08-09 DIAGNOSIS — O99212 Obesity complicating pregnancy, second trimester: Secondary | ICD-10-CM | POA: Insufficient documentation

## 2017-08-09 DIAGNOSIS — O09522 Supervision of elderly multigravida, second trimester: Secondary | ICD-10-CM | POA: Insufficient documentation

## 2017-08-09 DIAGNOSIS — Z3689 Encounter for other specified antenatal screening: Secondary | ICD-10-CM | POA: Insufficient documentation

## 2017-08-09 DIAGNOSIS — O162 Unspecified maternal hypertension, second trimester: Secondary | ICD-10-CM | POA: Diagnosis present

## 2017-08-09 DIAGNOSIS — E669 Obesity, unspecified: Secondary | ICD-10-CM | POA: Insufficient documentation

## 2017-08-09 DIAGNOSIS — O10012 Pre-existing essential hypertension complicating pregnancy, second trimester: Secondary | ICD-10-CM | POA: Insufficient documentation

## 2017-08-09 DIAGNOSIS — O169 Unspecified maternal hypertension, unspecified trimester: Secondary | ICD-10-CM

## 2017-08-10 ENCOUNTER — Other Ambulatory Visit (HOSPITAL_COMMUNITY): Payer: Self-pay | Admitting: *Deleted

## 2017-08-10 DIAGNOSIS — O10919 Unspecified pre-existing hypertension complicating pregnancy, unspecified trimester: Secondary | ICD-10-CM

## 2017-08-16 ENCOUNTER — Ambulatory Visit (HOSPITAL_COMMUNITY): Payer: Medicaid Other

## 2017-09-06 ENCOUNTER — Other Ambulatory Visit (HOSPITAL_COMMUNITY): Payer: Self-pay | Admitting: Obstetrics and Gynecology

## 2017-09-06 ENCOUNTER — Encounter (HOSPITAL_COMMUNITY): Payer: Self-pay

## 2017-09-06 ENCOUNTER — Ambulatory Visit (HOSPITAL_COMMUNITY)
Admission: RE | Admit: 2017-09-06 | Discharge: 2017-09-06 | Disposition: A | Discharge status: Home / Self Care | Payer: Medicaid Other | Source: Ambulatory Visit | Attending: Unknown Physician Specialty | Admitting: Unknown Physician Specialty

## 2017-09-06 ENCOUNTER — Other Ambulatory Visit (HOSPITAL_COMMUNITY): Payer: Self-pay | Admitting: *Deleted

## 2017-09-06 DIAGNOSIS — E669 Obesity, unspecified: Secondary | ICD-10-CM | POA: Diagnosis not present

## 2017-09-06 DIAGNOSIS — O09522 Supervision of elderly multigravida, second trimester: Secondary | ICD-10-CM | POA: Insufficient documentation

## 2017-09-06 DIAGNOSIS — O10012 Pre-existing essential hypertension complicating pregnancy, second trimester: Secondary | ICD-10-CM | POA: Diagnosis not present

## 2017-09-06 DIAGNOSIS — Z362 Encounter for other antenatal screening follow-up: Secondary | ICD-10-CM | POA: Diagnosis present

## 2017-09-06 DIAGNOSIS — Z3A22 22 weeks gestation of pregnancy: Secondary | ICD-10-CM

## 2017-09-06 DIAGNOSIS — O10919 Unspecified pre-existing hypertension complicating pregnancy, unspecified trimester: Secondary | ICD-10-CM

## 2017-09-06 DIAGNOSIS — O99212 Obesity complicating pregnancy, second trimester: Secondary | ICD-10-CM | POA: Insufficient documentation

## 2017-10-04 ENCOUNTER — Encounter (HOSPITAL_COMMUNITY): Payer: Self-pay

## 2017-10-04 ENCOUNTER — Ambulatory Visit (HOSPITAL_COMMUNITY)
Admission: RE | Admit: 2017-10-04 | Discharge: 2017-10-04 | Disposition: A | Discharge status: Home / Self Care | Payer: Medicaid Other | Source: Ambulatory Visit | Attending: Unknown Physician Specialty | Admitting: Unknown Physician Specialty

## 2017-10-04 ENCOUNTER — Other Ambulatory Visit (HOSPITAL_COMMUNITY): Payer: Self-pay | Admitting: Maternal & Fetal Medicine

## 2017-10-04 DIAGNOSIS — O09512 Supervision of elderly primigravida, second trimester: Secondary | ICD-10-CM

## 2017-10-04 DIAGNOSIS — O10919 Unspecified pre-existing hypertension complicating pregnancy, unspecified trimester: Secondary | ICD-10-CM

## 2017-10-04 DIAGNOSIS — Z3A26 26 weeks gestation of pregnancy: Secondary | ICD-10-CM

## 2017-10-04 DIAGNOSIS — O99212 Obesity complicating pregnancy, second trimester: Secondary | ICD-10-CM

## 2017-10-04 DIAGNOSIS — Z362 Encounter for other antenatal screening follow-up: Secondary | ICD-10-CM

## 2017-10-04 DIAGNOSIS — O09522 Supervision of elderly multigravida, second trimester: Secondary | ICD-10-CM | POA: Insufficient documentation

## 2017-10-04 DIAGNOSIS — E282 Polycystic ovarian syndrome: Secondary | ICD-10-CM

## 2017-11-01 ENCOUNTER — Other Ambulatory Visit (HOSPITAL_COMMUNITY): Payer: Self-pay | Admitting: Maternal & Fetal Medicine

## 2017-11-01 ENCOUNTER — Encounter (HOSPITAL_COMMUNITY): Payer: Self-pay

## 2017-11-01 ENCOUNTER — Ambulatory Visit (HOSPITAL_COMMUNITY)
Admission: RE | Admit: 2017-11-01 | Discharge: 2017-11-01 | Disposition: A | Discharge status: Home / Self Care | Payer: Medicaid Other | Source: Ambulatory Visit | Attending: Unknown Physician Specialty | Admitting: Unknown Physician Specialty

## 2017-11-01 DIAGNOSIS — Z362 Encounter for other antenatal screening follow-up: Secondary | ICD-10-CM

## 2017-11-01 DIAGNOSIS — I10 Essential (primary) hypertension: Secondary | ICD-10-CM

## 2017-11-01 DIAGNOSIS — O24410 Gestational diabetes mellitus in pregnancy, diet controlled: Secondary | ICD-10-CM | POA: Insufficient documentation

## 2017-11-01 DIAGNOSIS — Z3A30 30 weeks gestation of pregnancy: Secondary | ICD-10-CM

## 2017-11-01 DIAGNOSIS — O10913 Unspecified pre-existing hypertension complicating pregnancy, third trimester: Secondary | ICD-10-CM | POA: Insufficient documentation

## 2017-11-01 DIAGNOSIS — O09523 Supervision of elderly multigravida, third trimester: Secondary | ICD-10-CM | POA: Insufficient documentation

## 2017-11-01 DIAGNOSIS — O99213 Obesity complicating pregnancy, third trimester: Secondary | ICD-10-CM | POA: Diagnosis not present

## 2017-11-29 ENCOUNTER — Encounter (HOSPITAL_COMMUNITY): Payer: Self-pay

## 2017-11-29 ENCOUNTER — Ambulatory Visit (HOSPITAL_COMMUNITY)
Admission: RE | Admit: 2017-11-29 | Discharge: 2017-11-29 | Disposition: A | Discharge status: Home / Self Care | Payer: Medicaid Other | Source: Ambulatory Visit | Attending: Unknown Physician Specialty | Admitting: Unknown Physician Specialty

## 2017-11-29 ENCOUNTER — Other Ambulatory Visit (HOSPITAL_COMMUNITY): Payer: Self-pay | Admitting: Maternal & Fetal Medicine

## 2017-11-29 DIAGNOSIS — Z362 Encounter for other antenatal screening follow-up: Secondary | ICD-10-CM | POA: Diagnosis not present

## 2017-11-29 DIAGNOSIS — O24410 Gestational diabetes mellitus in pregnancy, diet controlled: Secondary | ICD-10-CM

## 2017-11-29 DIAGNOSIS — O10013 Pre-existing essential hypertension complicating pregnancy, third trimester: Secondary | ICD-10-CM | POA: Insufficient documentation

## 2017-11-29 DIAGNOSIS — Z3A34 34 weeks gestation of pregnancy: Secondary | ICD-10-CM | POA: Diagnosis not present

## 2017-11-29 DIAGNOSIS — O99213 Obesity complicating pregnancy, third trimester: Secondary | ICD-10-CM | POA: Insufficient documentation

## 2017-11-29 DIAGNOSIS — O10919 Unspecified pre-existing hypertension complicating pregnancy, unspecified trimester: Secondary | ICD-10-CM

## 2017-11-29 DIAGNOSIS — O09523 Supervision of elderly multigravida, third trimester: Secondary | ICD-10-CM

## 2018-03-30 ENCOUNTER — Encounter (HOSPITAL_COMMUNITY): Payer: Self-pay

## 2018-08-30 ENCOUNTER — Other Ambulatory Visit: Payer: Self-pay

## 2018-08-30 ENCOUNTER — Emergency Department
Admission: EM | Admit: 2018-08-30 | Discharge: 2018-08-30 | Disposition: A | Discharge status: Home / Self Care | Payer: Medicaid Other | Attending: Emergency Medicine | Admitting: Emergency Medicine

## 2018-08-30 ENCOUNTER — Encounter: Payer: Self-pay | Admitting: Emergency Medicine

## 2018-08-30 DIAGNOSIS — I10 Essential (primary) hypertension: Secondary | ICD-10-CM | POA: Insufficient documentation

## 2018-08-30 DIAGNOSIS — J028 Acute pharyngitis due to other specified organisms: Secondary | ICD-10-CM | POA: Diagnosis not present

## 2018-08-30 DIAGNOSIS — J029 Acute pharyngitis, unspecified: Secondary | ICD-10-CM | POA: Diagnosis present

## 2018-08-30 DIAGNOSIS — Z79899 Other long term (current) drug therapy: Secondary | ICD-10-CM | POA: Insufficient documentation

## 2018-08-30 DIAGNOSIS — B9789 Other viral agents as the cause of diseases classified elsewhere: Secondary | ICD-10-CM | POA: Diagnosis not present

## 2018-08-30 LAB — GROUP A STREP BY PCR: Group A Strep by PCR: NOT DETECTED

## 2018-08-30 MED ORDER — MAGIC MOUTHWASH: 5.0000 mL | Freq: Three times a day (TID) | ORAL | 0 refills | Status: DC | PRN

## 2018-08-30 MED ORDER — DEXAMETHASONE SODIUM PHOSPHATE 10 MG/ML IJ SOLN
10.0000 mg | Freq: Once | INTRAMUSCULAR | Status: AC
  Administered 2018-08-30: 10 mg via INTRAMUSCULAR
  Filled 2018-08-30: qty 1

## 2018-08-30 MED ORDER — DEXAMETHASONE SODIUM PHOSPHATE 10 MG/ML IJ SOLN
10.0000 mg | Freq: Once | INTRAMUSCULAR | Status: DC
Start: 2018-08-30 — End: 2018-08-30

## 2018-08-30 MED ORDER — MAGIC MOUTHWASH: 10.0000 mL | Freq: Once | ORAL | Status: DC

## 2018-08-30 NOTE — Discharge Instructions (Addendum)
You may use Magic mouthwash as needed for throat discomfort.  Return to the ER for worsening symptoms, persistent vomiting, difficulty breathing or other concerns. 

## 2018-08-30 NOTE — ED Triage Notes (Signed)
Patient ambulatory to triage with steady gait, without difficulty or distress noted; pt reports since yesterday having sore throat, sinus drainage; denies fever

## 2018-08-30 NOTE — ED Provider Notes (Signed)
[facility] Emergency Department Provider Note   ____________________________________________   First MD Initiated Contact with Patient 08/30/18 [phone]     (approximate)  I have reviewed the triage vital signs and the nursing notes.   HISTORY  Chief Complaint Sore Throat    HPI Tammy Taylor is a 38 y.o. female who presents to the ED from home with a chief complaint of sore throat and sinus drainage.  Patient reports symptoms started yesterday.  Her brother also makes some cleaning products so she is not sure if inhaling the fumes have also triggered her symptoms.  Complains of nonproductive cough.  Denies associated chest pain, shortness of breath, abdominal pain, nausea, vomiting or diarrhea.  Denies recent travel or trauma.   Past Medical History:  Diagnosis Date  . Hypertension   . Kidney stones   . PCOS (polycystic ovarian syndrome)     Patient Active Problem List   Diagnosis Date Noted  . Morbid obesity (HCC) 05/13/2017  . Essential hypertension affecting pregnancy in first trimester 05/13/2017  . AMA (advanced maternal age) multigravida 35+, first trimester 05/13/2017    History reviewed. No pertinent surgical history.  Prior to Admission medications   Medication Sig Start Date End Date Taking? Authorizing Provider  ASPIRIN 81 PO Take by mouth.    [provider]  labetalol (NORMODYNE) 200 MG tablet Take 1 tablet (200 mg total) by mouth 2 (two) times daily. 05/13/17   Poe, Deirdre C, CNM  magic mouthwash SOLN Take 5 mLs by mouth 3 (three) times daily as needed for mouth pain. 08/30/18   Irean Hong, MD  prenatal vitamin w/FE, FA (PRENATAL 1 + 1) 27-1 MG TABS tablet Take 1 tablet by mouth daily. 05/13/17   Poe, Deirdre C, CNM  Promethazine HCl (PHENERGAN PO) Take by mouth.    [provider]    Allergies Patient has no known allergies.  Family History  Problem Relation Age of Onset  . Diabetes Father   . Cancer  Paternal Aunt     Social History Social History   Tobacco Use  . Smoking status: Never Smoker  . Smokeless tobacco: Never Used  Substance Use Topics  . Alcohol use: No  . Drug use: No    Review of Systems  Constitutional: No fever/chills Eyes: No visual changes. ENT: Positive for sinus congestion and sore throat. Cardiovascular: Denies chest pain. Respiratory: Positive for nonproductive cough.  Denies shortness of breath. Gastrointestinal: No abdominal pain.  No nausea, no vomiting.  No diarrhea.  No constipation. Genitourinary: Negative for dysuria. Musculoskeletal: Negative for back pain. Skin: Negative for rash. Neurological: Negative for headaches, focal weakness or numbness.   ____________________________________________   PHYSICAL EXAM:  VITAL SIGNS: ED Triage Vitals  Enc Vitals Group     BP 08/30/18 0420 (!) 212/104     Pulse Rate 08/30/18 0420 85     Resp 08/30/18 0420 18     Temp 08/30/18 0420 98 F (36.7 C)     Temp Source 08/30/18 0420 Oral     SpO2 08/30/18 0420 99 %     Weight 08/30/18 0419 (!) 346 lb (156.9 kg)     Height 08/30/18 0419 5\' 7"  (1.702 m)     Head Circumference --      Peak Flow --      Pain Score 08/30/18 0419 4     Pain Loc --      Pain Edu? --      Excl.  in GC? --     Constitutional: Alert and oriented. Well appearing and in no acute distress. Eyes: Conjunctivae are normal. PERRL. EOMI. Head: Atraumatic. Ears: Bilateral TM dullness. Nose: Congestion/rhinnorhea. Mouth/Throat: Mucous membranes are moist.  Oropharynx mildly erythematous with mild bilateral tonsillar swelling.  No tonsillar exudates or peritonsillar abscess.  Mildly hoarse voice.  There is no muffled voice or drooling. Neck: No stridor.  Supple neck without meningismus. Cardiovascular: Normal rate, regular rhythm. Grossly normal heart sounds.  Good peripheral circulation. Respiratory: Normal respiratory effort.  No retractions. Lungs CTAB. Gastrointestinal: Soft  and nontender. No distention. No abdominal bruits. No CVA tenderness. Musculoskeletal: No lower extremity tenderness nor edema.  No joint effusions. Neurologic:  Normal speech and language. No gross focal neurologic deficits are appreciated. No gait instability. Skin:  Skin is warm, dry and intact. No rash noted.  No petechiae. Psychiatric: Mood and affect are normal. Speech and behavior are normal.  ____________________________________________   LABS (all labs ordered are listed, but only abnormal results are displayed)  Labs Reviewed  GROUP A STREP BY PCR   ____________________________________________  EKG  None ____________________________________________  RADIOLOGY  ED MD interpretation: None  Official radiology report(s): No results found.  ____________________________________________   PROCEDURES  Procedure(s) performed: None  Procedures  Critical Care performed: No  ____________________________________________   INITIAL IMPRESSION / ASSESSMENT AND PLAN / ED COURSE  As part of my medical decision making, I reviewed the following data within the electronic MEDICAL RECORD NUMBER Nursing notes reviewed and incorporated, Labs reviewed and Notes from prior ED visits   38 year old female who presents with sore throat and sinus congestion.  Will administer IM Decadron, administer Magic mouthwash and patient will follow-up closely with her PCP.  Strict return precautions given.  Patient verbalizes understanding and agrees with plan of care.      ____________________________________________   FINAL CLINICAL IMPRESSION(S) / ED DIAGNOSES  Final diagnoses:  Viral pharyngitis  Sore throat     ED Discharge Orders         Ordered    magic mouthwash SOLN  3 times daily PRN     08/30/18 [id number]           Note:  This document was prepared using Dragon voice recognition software and may include unintentional dictation errors.    Irean HongSung, Other Atienza J, MD 08/30/18  [phone]

## 2018-08-30 NOTE — ED Notes (Signed)
Magic mouthwash is not available called pharmacy and they stated that the hospital was out of this medication. Dr. Dolores Frame made aware.

## 2018-08-30 NOTE — ED Notes (Signed)
Pt stated that she has been having a sore throat and sinus drainage since yesterday. Denies any fever.

## 2019-01-24 IMAGING — US US OB COMP LESS 14 WK
1 series · 15 of 28 positions shown · non-contrast
Comparison: None.

CLINICAL DATA: Right lower quadrant abdominal pain. Pregnant
patient.

EXAM:
OBSTETRIC <14 WK US AND TRANSVAGINAL OB US
TECHNIQUE: Both transabdominal and transvaginal ultrasound examinations were
performed for complete evaluation of the gestation as well as the
maternal uterus, adnexal regions, and pelvic cul-de-sac.
Transvaginal technique was performed to assess early pregnancy.

[Series 1: us ob comp less 14 wk · 15 of 29 slices shown]
[im 1/29]
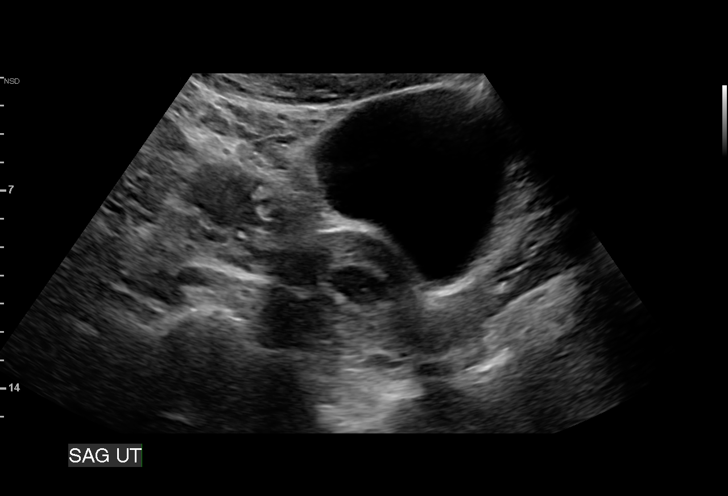
[im 3/29]
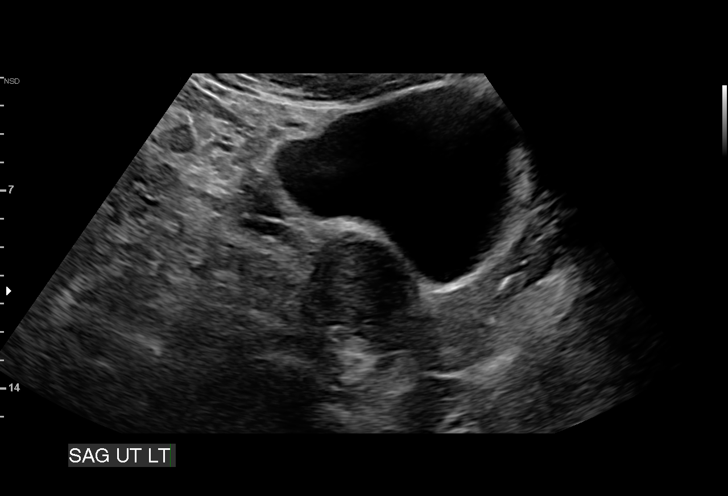
[im 5/29]
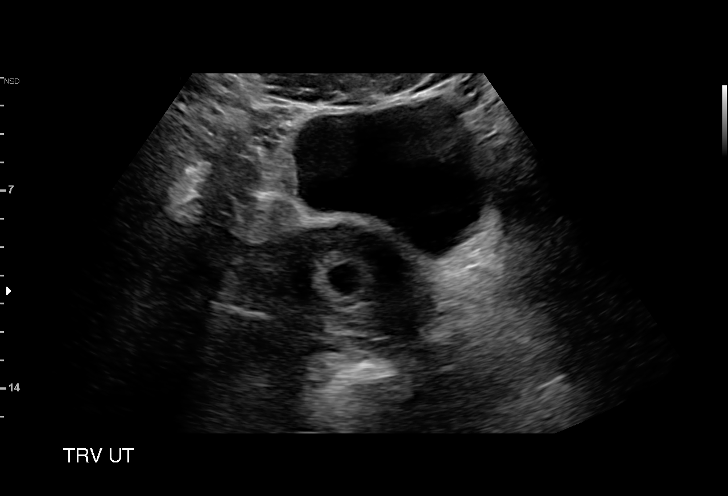
[im 7/29]
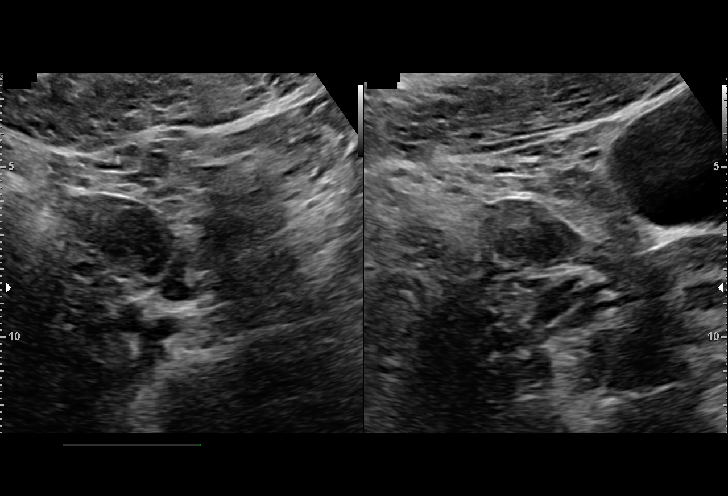
[im 9/29]
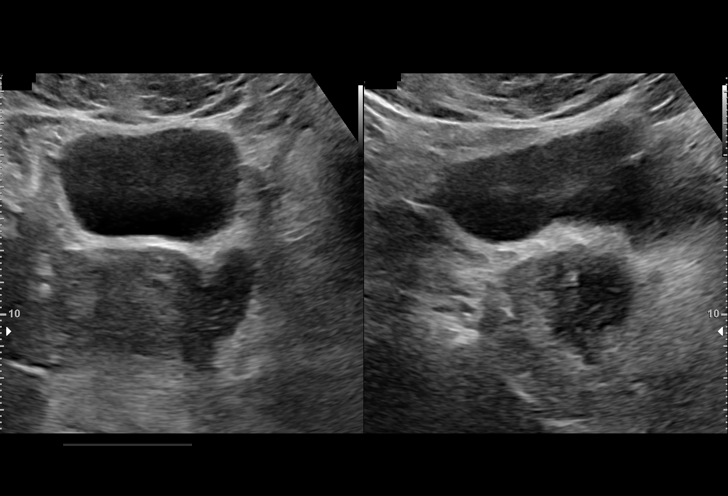
[im 11/29]
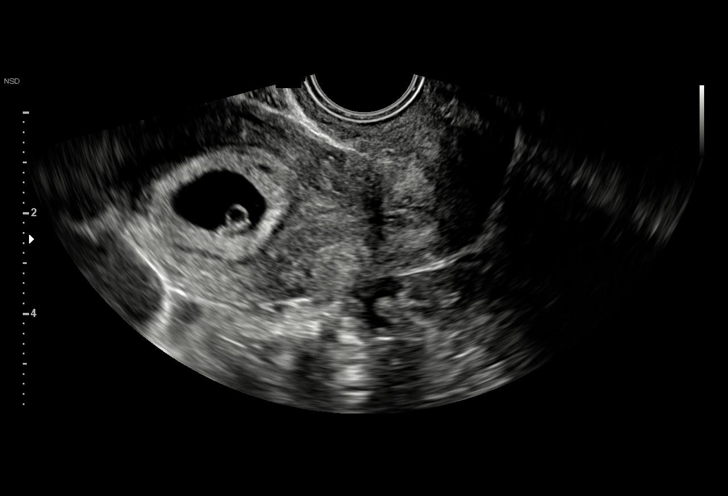
[im 13/29]
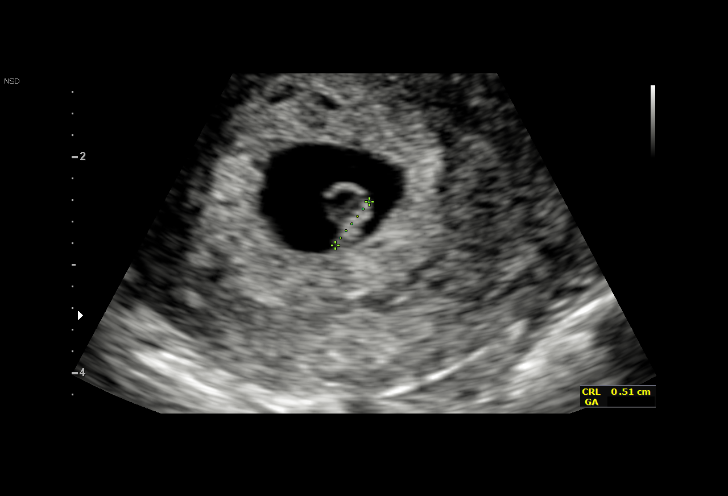
[im 15/29]
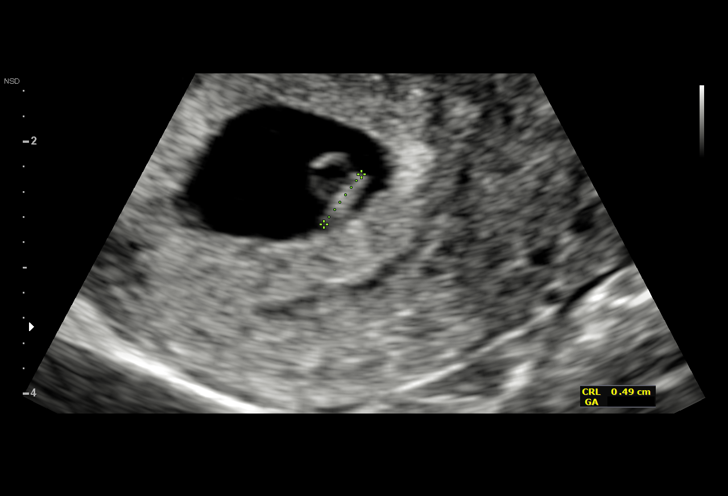
[im 16/29]
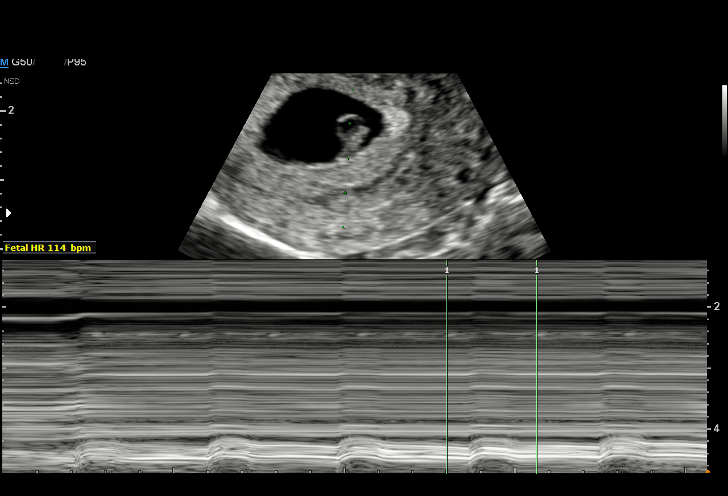
[im 18/29]
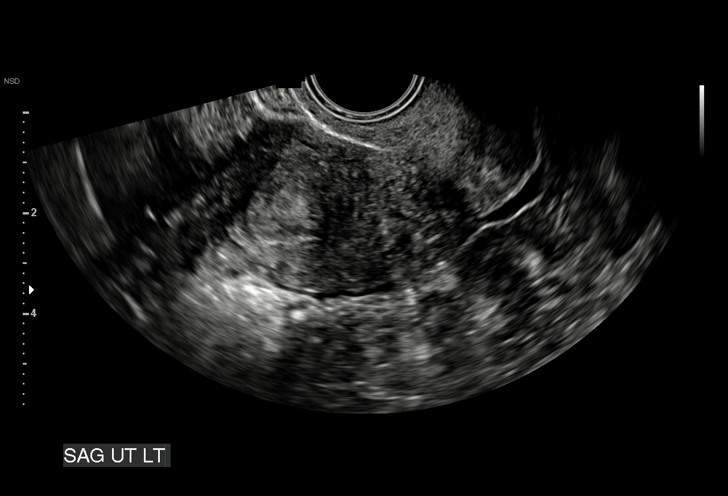
[im 20/29]
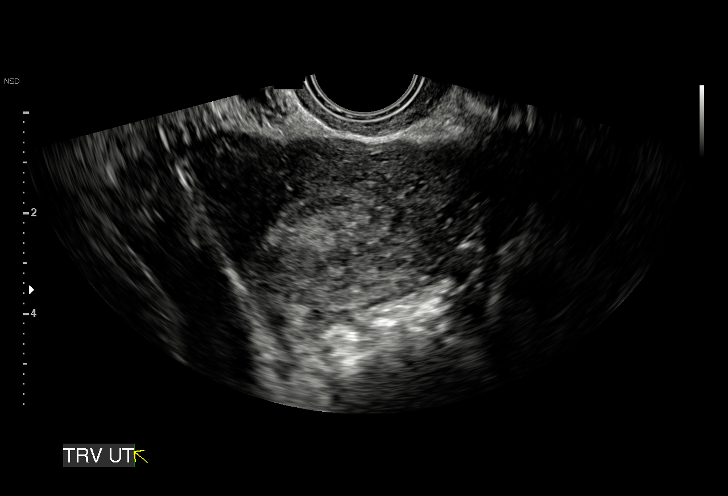
[im 22/29]
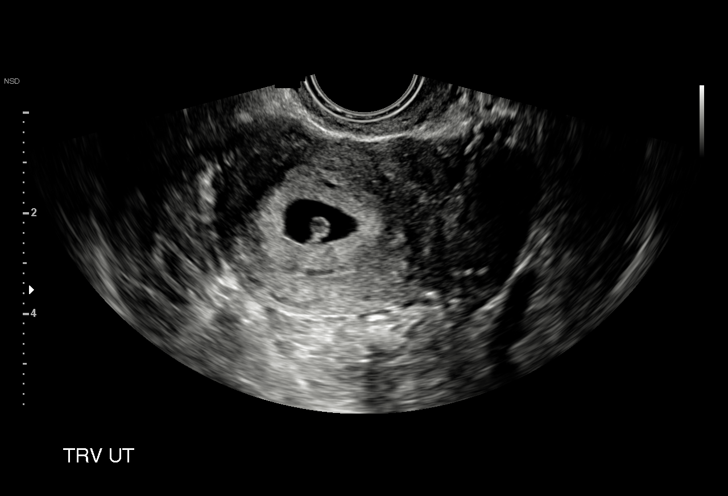
[im 24/29]
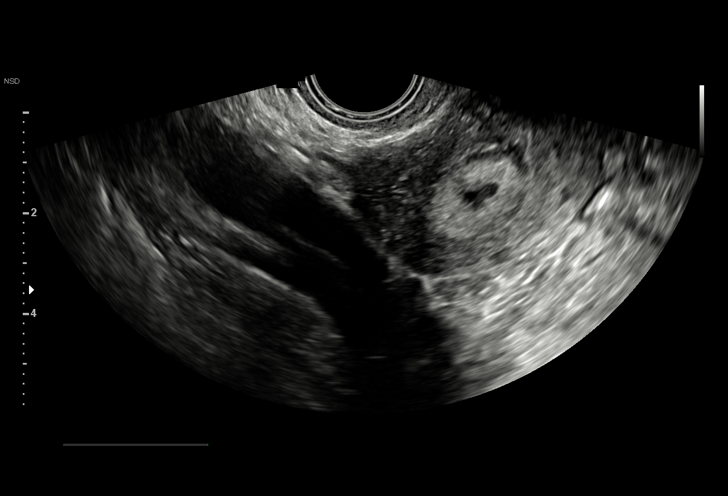
[im 26/29]
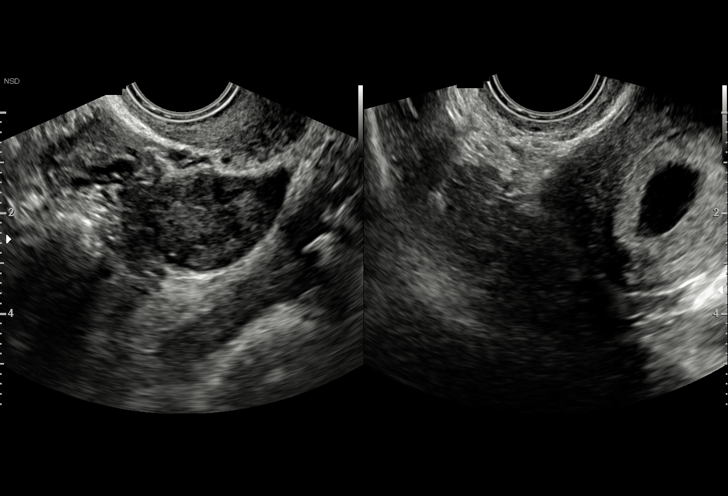
[im 29/29]
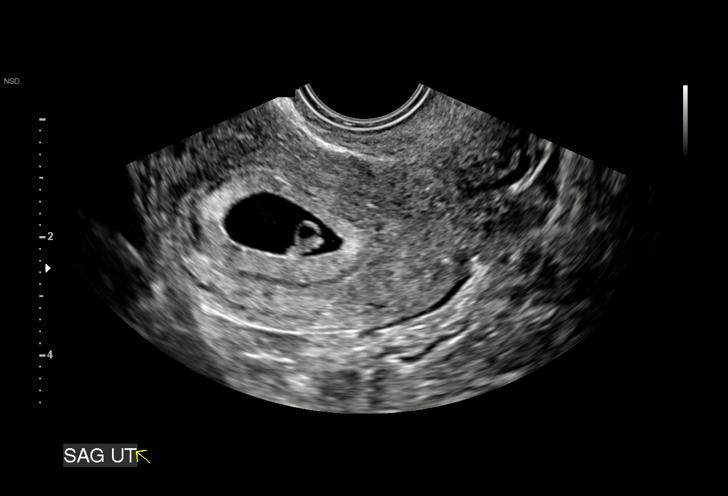

[15 of 28 positions shown; findings below may reference images not displayed]

FINDINGS: Intrauterine gestational sac: Single

Yolk sac:  Visualized.

Embryo:  Visualized.

Cardiac Activity: Visualized.

Heart Rate: 114 bpm

MSD:   mm    w     d

CRL:  5  mm   6 w   1 d                  US EDC: January 05, 2018

Subchorionic hemorrhage:  None visualized.

Maternal uterus/adnexae: Normal in appearance. Trace fluid in the
pelvis is likely physiologic.
IMPRESSION: 1. Single live IUP.  Trace physiologic fluid in the pelvis.

## 2019-02-11 ENCOUNTER — Ambulatory Visit: Payer: Medicaid Other | Admitting: Family Medicine

## 2019-02-18 ENCOUNTER — Ambulatory Visit: Payer: Medicaid Other | Admitting: Family Medicine

## 2019-04-22 IMAGING — US US MFM OB DETAIL+14 WK
1 series · 13 of 28 positions shown · non-contrast
Comparison: none

Center
[REDACTED] 00000

Indications
18 weeks gestation of pregnancy
Advanced maternal age multigravida 35+,
second trimester
Hypertension - Chronic/Pre-existing
Obesity complicating pregnancy, second
trimester (Pre Pregnancy BMI 54)
Encounter for fetal anatomic survey
OB History
Blood Type:            Height:  5'7"   Weight (lb):  340       BMI:
Gravidity:    1
Fetal Evaluation
Num Of Fetuses:     1
Fetal Heart         157
Rate(bpm):
Cardiac Activity:   Observed
Presentation:       Breech
Placenta:           Posterior, above cervical os
P. Cord Insertion:  Not well visualized
Amniotic Fluid
AFI FV:      Subjectively within normal limits
Largest Pocket(cm)
5.09
Biometry
BPD:        44  mm     G. Age:  19w 2d         75  %    CI:        74.26   %    70 - 86
FL/HC:      18.0   %    16.1 -
HC:      162.1  mm     G. Age:  19w 0d         56  %    HC/AC:      1.21        1.09 -
AC:      133.9  mm     G. Age:  18w 6d         52  %    FL/BPD:     66.4   %
FL:       29.2  mm     G. Age:  19w 0d         54  %    FL/AC:      21.8   %    20 - 24
HUM:        28  mm     G. Age:  19w 0d         60  %
Est. FW:     266  gm      0 lb 9 oz     50  %
Gestational Age
LMP:           22w 2d        Date:  03/06/17                 EDD:   12/11/17
U/S Today:     19w 0d                                        EDD:   01/03/18
Best:          18w 5d     Det. By:  Early Ultrasound         EDD:   01/05/18
(05/13/17)
Anatomy
Cranium:               Appears normal         Aortic Arch:            Not well visualized
Cavum:                 Appears normal         Ductal Arch:            Not well visualized
Ventricles:            Appears normal         Diaphragm:              Not well visualized
Choroid Plexus:        Appears normal         Stomach:                Appears normal, left
sided
Cerebellum:            Not well visualized    Abdomen:                Appears normal
Posterior Fossa:       Not well visualized    Abdominal Wall:         Not well visualized
Nuchal Fold:           Not well visualized    Cord Vessels:           Appears normal (3
vessel cord)
Face:                  Profile appears        Kidneys:                Not well visualized
normal
Lips:                  Not well visualized    Bladder:                Appears normal
Thoracic:              Appears normal         Spine:                  Not well visualized
Heart:                 Not well visualized    Upper Extremities:      Appears normal;
hands nwv
RVOT:                  Not well visualized    Lower Extremities:      Appears normal;
heels nwv
LVOT:                  Not well visualized
Other:  Technically difficult due to maternal habitus and fetal position.
Cervix Uterus Adnexa
Cervix
Length:           3.04  cm.
Uterus
No abnormality visualized.
Left Ovary
Not visualized.
Right Ovary
Impression
INDICATION: 36 yr old G1P0 at 58w2d with chronic
hypertension and morbid obesity for fetal anatomic survey.

[Series 1: us mfm ob detail+14 wk · 13 of 53 slices shown]
[im 2/53]
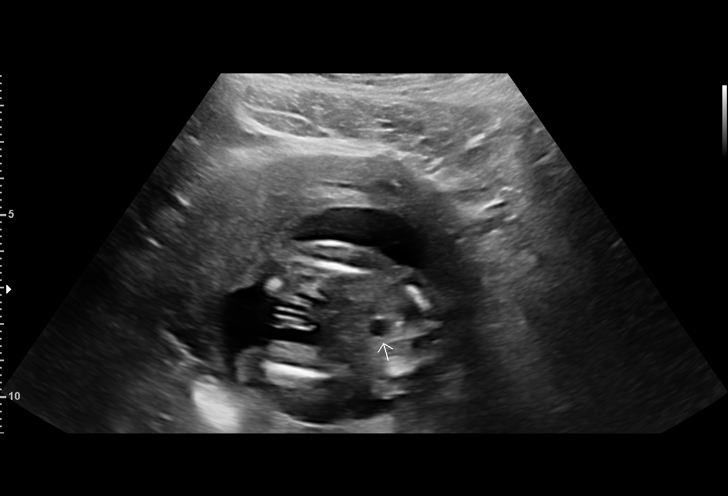
[im 6/53]
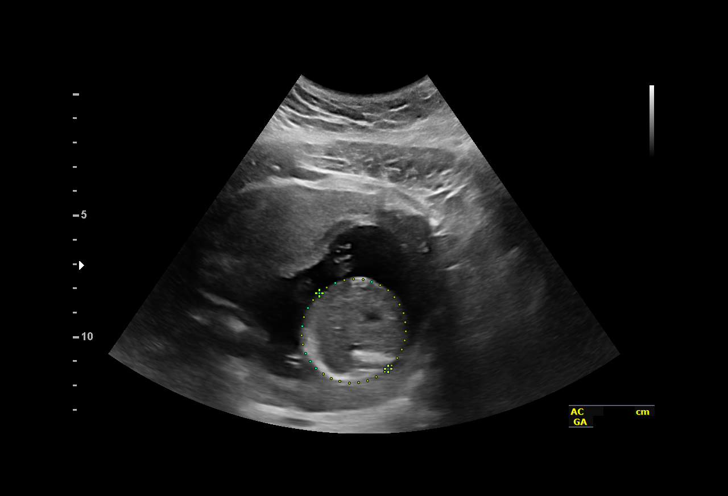
[im 10/53]
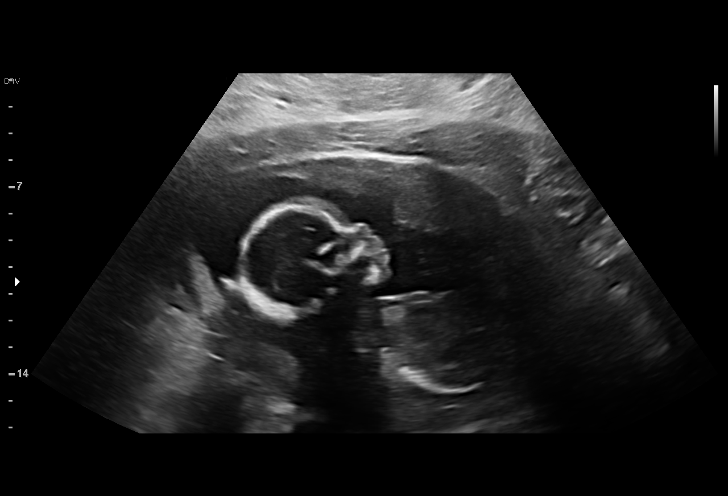
[im 14/53]
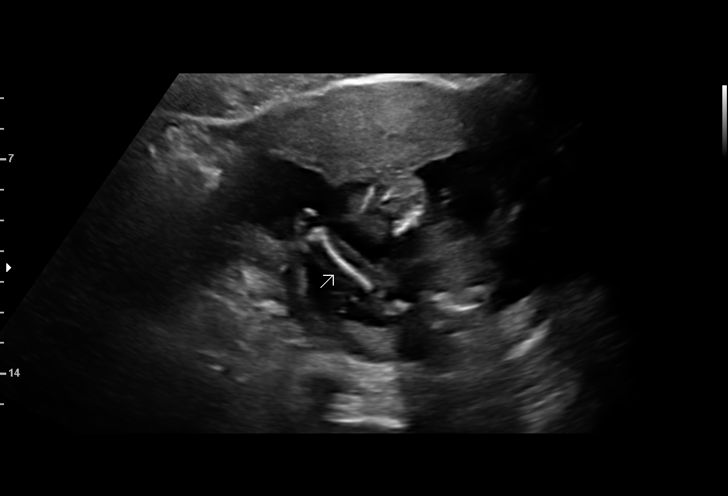
[im 18/53]
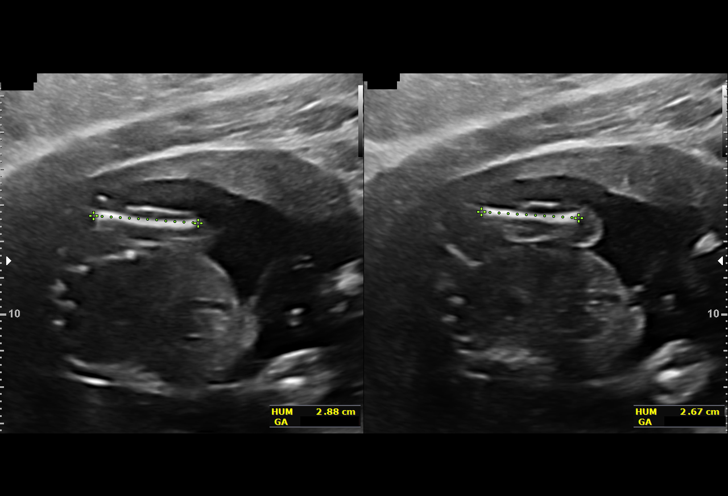
[im 22/53]
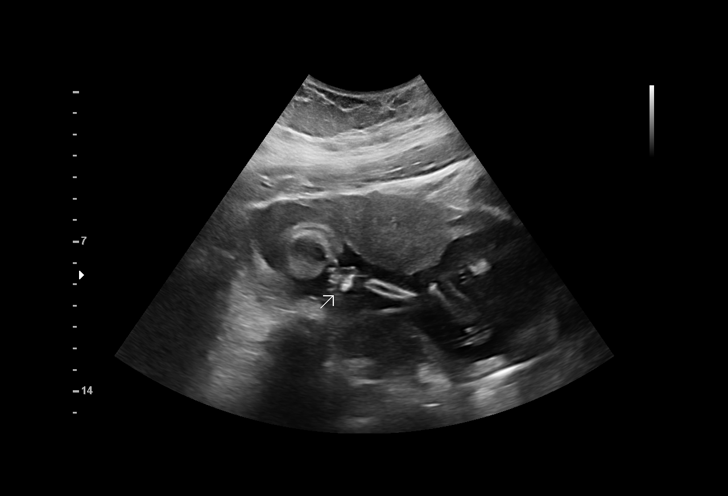
[im 27/53]
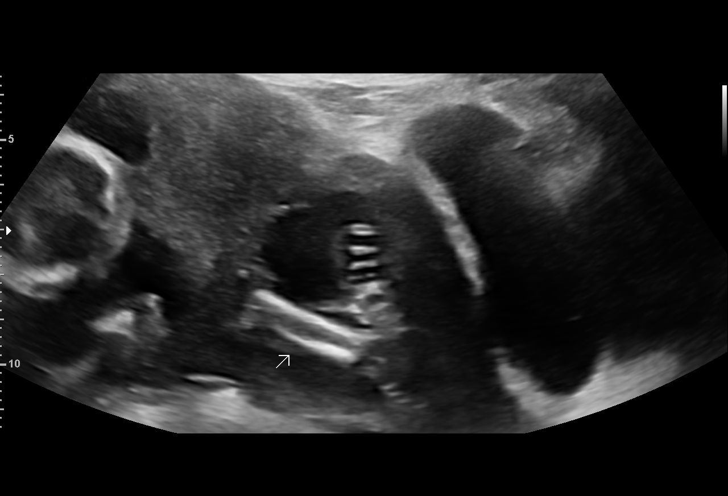
[im 31/53]
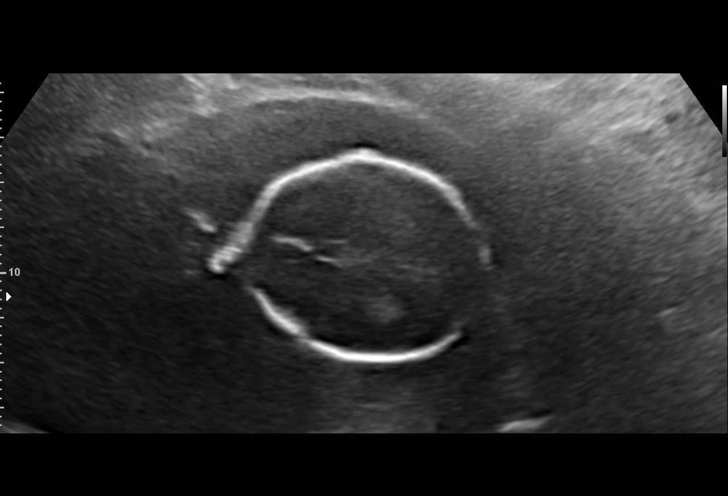
[im 35/53]
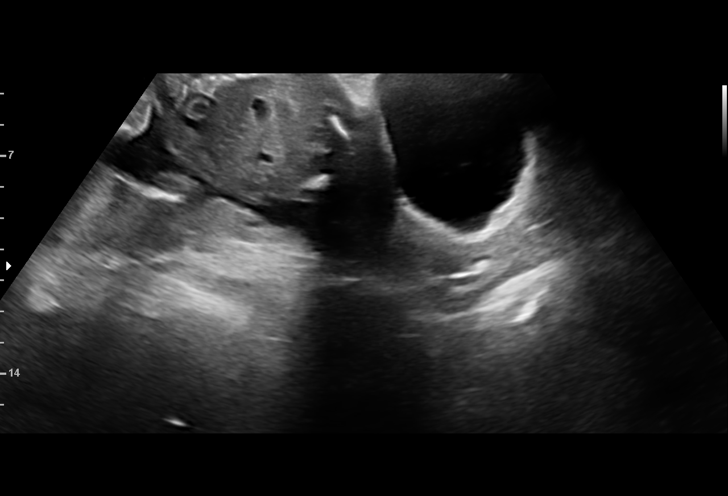
[im 39/53]
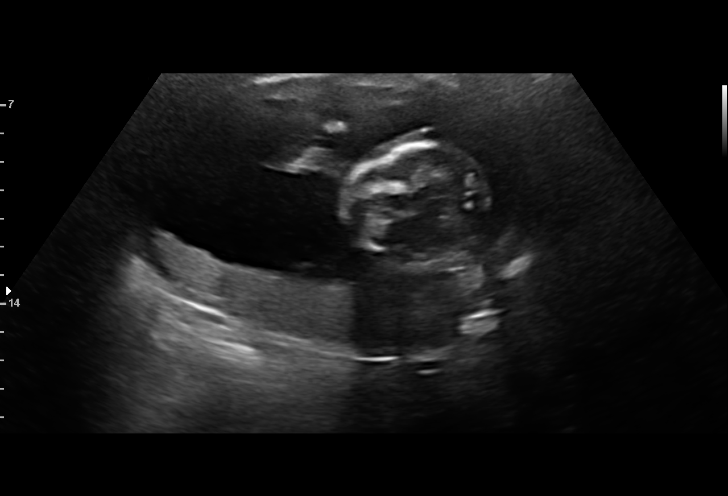
[im 43/53]
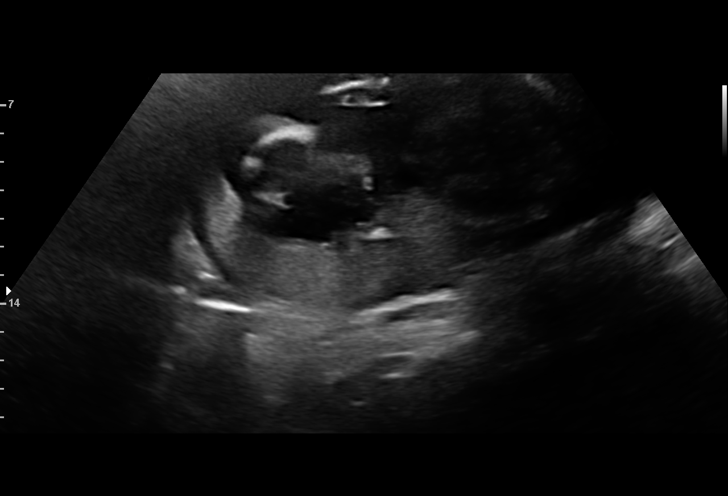
[im 47/53]
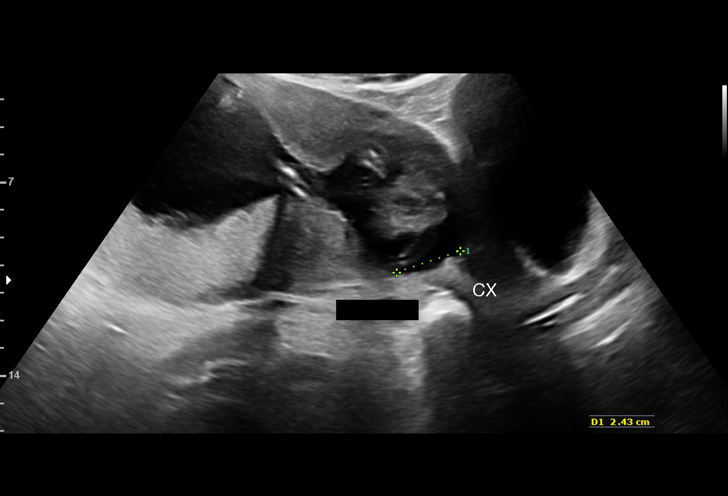
[im 51/53]
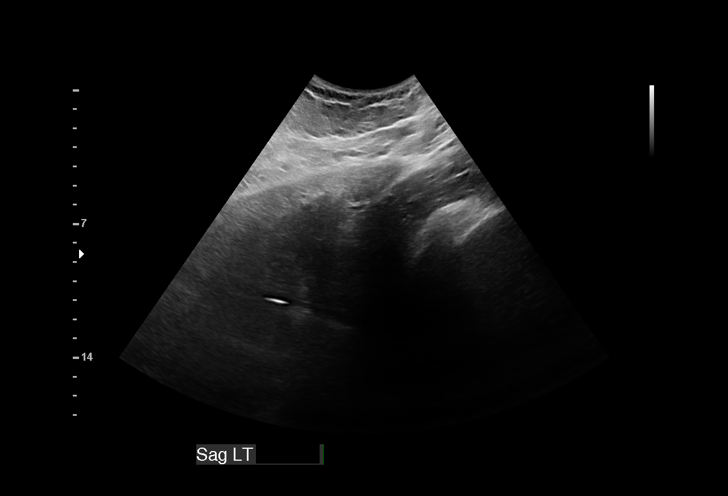

[13 of 28 positions shown; findings below may reference images not displayed]

FINDINGS: 1. Single intrauterine pregnancy.
2. Fetal biometry is consistent with dating.
3. Posterior placenta without evidence of previa.
4. Normal amniotic fluid volume.
5. Normal transabdominal cervical length.
6. The anatomy survey is limited as above; no abnormalities
are seen on limited exam.
Recommendations

1. Appropriate fetal growth.
2. Limited anatomy survey:
- recommend follow up in 4 weeks to reevaluate fetal anatomy
3. Hypertension:
- previously counseled
- on labetalol and low dose aspirin
- recommend fetal growth every 4 weeks
- recommend start antenatal testing at 32 weeks
- recommend close surveillance for the development of
signs/symptoms of preeclampsia
4. Obesity:
- previously counseled
- recommend fetal surveillance as above
5. Advanced maternal age:
- low risk first trimester screen

## 2019-08-29 DIAGNOSIS — I499 Cardiac arrhythmia, unspecified: Secondary | ICD-10-CM

## 2019-08-29 HISTORY — DX: Cardiac arrhythmia, unspecified: I49.9

## 2019-10-31 ENCOUNTER — Other Ambulatory Visit: Payer: Self-pay

## 2019-10-31 ENCOUNTER — Emergency Department (HOSPITAL_COMMUNITY)
Admission: EM | Admit: 2019-10-31 | Discharge: 2019-10-31 | Disposition: A | Discharge status: Home / Self Care | Payer: Medicaid Other | Attending: Emergency Medicine | Admitting: Emergency Medicine

## 2019-10-31 ENCOUNTER — Emergency Department (HOSPITAL_COMMUNITY): Payer: Medicaid Other

## 2019-10-31 ENCOUNTER — Encounter (HOSPITAL_COMMUNITY): Payer: Self-pay

## 2019-10-31 DIAGNOSIS — E119 Type 2 diabetes mellitus without complications: Secondary | ICD-10-CM | POA: Diagnosis not present

## 2019-10-31 DIAGNOSIS — Z79899 Other long term (current) drug therapy: Secondary | ICD-10-CM | POA: Diagnosis not present

## 2019-10-31 DIAGNOSIS — I10 Essential (primary) hypertension: Secondary | ICD-10-CM | POA: Diagnosis not present

## 2019-10-31 DIAGNOSIS — Z7982 Long term (current) use of aspirin: Secondary | ICD-10-CM | POA: Diagnosis not present

## 2019-10-31 DIAGNOSIS — M25511 Pain in right shoulder: Secondary | ICD-10-CM | POA: Insufficient documentation

## 2019-10-31 HISTORY — DX: Type 2 diabetes mellitus without complications: E11.9

## 2019-10-31 MED ORDER — METHOCARBAMOL 500 MG PO TABS: 500.0000 mg | ORAL_TABLET | Freq: Three times a day (TID) | ORAL | 0 refills | Status: DC | PRN

## 2019-10-31 MED ORDER — NAPROXEN 500 MG PO TABS: 500.0000 mg | ORAL_TABLET | Freq: Two times a day (BID) | ORAL | 0 refills | Status: DC

## 2019-10-31 NOTE — Discharge Instructions (Addendum)
Please read and follow all provided instructions.  You have been seen today for right shoulder pain after lifting.   Tests performed today include: An x-ray of the affected area - does NOT show any broken bones or dislocations.  Vital signs. See below for your results today.   Home care instructions: -- *PRICE in the first 24-48 hours after injury: Protect (with brace, splint, sling), if given by your provider Rest Ice- Do not apply ice pack directly to your skin, place towel or similar between your skin and ice/ice pack. Apply ice for 20 min, then remove for 40 min while awake Compression- Wear brace, elastic bandage, splint as directed by your provider Elevate affected extremity above the level of your heart when not walking around for the first 24-48 hours   Medications:  - Naproxen is a nonsteroidal anti-inflammatory medication that will help with pain and swelling. Be sure to take this medication as prescribed with food, 1 pill every 12 hours,  It should be taken with food, as it can cause stomach upset, and more seriously, stomach bleeding. Do not take other nonsteroidal anti-inflammatory medications with this such as Advil, Motrin, Aleve, Mobic, Goodie Powder, or Motrin.    - Robaxin is the muscle relaxer I have prescribed, this is meant to help with muscle tightness. Be aware that this medication may make you drowsy therefore the first time you take this it should be at a time you are in an environment where you can rest. Do not drive or operate heavy machinery when taking this medication. Do not drink alcohol or take other sedating medications with this medicine such as narcotics or benzodiazepines.   You make take Tylenol per over the counter dosing with these medications.   We have prescribed you new medication(s) today. Discuss the medications prescribed today with your pharmacist as they can have adverse effects and interactions with your other medicines including over the counter  and prescribed medications. Seek medical evaluation if you start to experience new or abnormal symptoms after taking one of these medicines, seek care immediately if you start to experience difficulty breathing, feeling of your throat closing, facial swelling, or rash as these could be indications of a more serious allergic reaction   Follow-up instructions: Please follow-up with your primary care provider or the provided orthopedic physician (bone specialist) if you continue to have significant pain in 1 week. In this case you may have a more severe injury that requires further care.   Return instructions:  Please return if your digits or extremity are numb or tingling, appear gray or blue, or you have severe pain (also elevate the extremity and loosen splint or wrap if you were given one) Please return if you have redness or fevers.  Please return to the Emergency Department if you experience worsening symptoms.  Please return if you have any other emergent concerns. Additional Information:  Your vital signs today were: BP (!) 212/108 (BP Location: Left Wrist)   Pulse 81   Temp 98.4 F (36.9 C) (Oral)   Resp 18   SpO2 100%  If your blood pressure (BP) was elevated above 135/85 this visit, please have this repeated by your doctor within one month. ---------------

## 2019-10-31 NOTE — ED Provider Notes (Signed)
[facility] EMERGENCY DEPARTMENT Provider Note   CSN: [id number] Arrival date & time: 10/31/19  1532     History Chief Complaint  Patient presents with  . Shoulder Injury    Tammy Taylor is a 39 y.o. female with a history of diabetes mellitus, hypertension, PCOS, and kidney stones who presents to the emergency department with complaints of L shoulder pain which began shortly PTA. Patient states that she attempted to lift her child when she felt a tearing sensation in her right shoulder with acute onset of pain.  States the pain is constant, worse with movement, no alleviating factors.  No intervention prior to arrival.  Denies any other areas of injury.  Denies numbness, tingling, or weakness.  Her blood pressure was noted to be elevated in the emergency department, she states she took her blood pressure medication and that her blood pressure is always high.  She denies headache, chest pain, dyspnea, numbness, weakness, visual disturbance, or dizziness.  HPI     Past Medical History:  Diagnosis Date  . Diabetes mellitus without complication (HCC)   . Hypertension   . Kidney stones   . PCOS (polycystic ovarian syndrome)     Patient Active Problem List   Diagnosis Date Noted  . Morbid obesity (HCC) 05/13/2017  . Essential hypertension affecting pregnancy in first trimester 05/13/2017  . AMA (advanced maternal age) multigravida 35+, first trimester 05/13/2017    History reviewed. No pertinent surgical history.   OB History    Gravida  1   Para      Term      Preterm      AB      Living  0     SAB      TAB      Ectopic      Multiple      Live Births              Family History  Problem Relation Age of Onset  . Diabetes Father   . Cancer Paternal Aunt     Social History   Tobacco Use  . Smoking status: Never Smoker  . Smokeless tobacco: Never Used  Substance Use Topics  . Alcohol use: No  . Drug use: No    Home Medications Prior to  Admission medications   Medication Sig Start Date End Date Taking? Authorizing Provider  ASPIRIN 81 PO Take by mouth.    [provider]  labetalol (NORMODYNE) 200 MG tablet Take 1 tablet (200 mg total) by mouth 2 (two) times daily. 05/13/17   Poe, Deirdre C, CNM  magic mouthwash SOLN Take 5 mLs by mouth 3 (three) times daily as needed for mouth pain. 08/30/18   Irean Hong, MD  prenatal vitamin w/FE, FA (PRENATAL 1 + 1) 27-1 MG TABS tablet Take 1 tablet by mouth daily. 05/13/17   Poe, Deirdre C, CNM  Promethazine HCl (PHENERGAN PO) Take by mouth.    [provider]    Allergies    Patient has no known allergies.  Review of Systems   Review of Systems  Constitutional: Negative for chills and fever.  Respiratory: Negative for shortness of breath.   Cardiovascular: Negative for chest pain.  Musculoskeletal: Positive for arthralgias.  Neurological: Negative for dizziness, seizures, syncope, facial asymmetry, speech difficulty, weakness, light-headedness, numbness and headaches.    Physical Exam Updated Vital Signs BP (!) 212/108 (BP Location: Left Wrist)   Pulse 81   Temp 98.4 F (36.9 C) (  Oral)   Resp 18   SpO2 100%   Physical Exam Vitals and nursing note reviewed.  Constitutional:      General: She is not in acute distress.    Appearance: Normal appearance. She is well-developed. She is not ill-appearing or toxic-appearing.  HENT:     Head: Normocephalic and atraumatic.  Eyes:     General:        Right eye: No discharge.        Left eye: No discharge.     Conjunctiva/sclera: Conjunctivae normal.  Neck:     Comments: No midline tenderness.  Cardiovascular:     Rate and Rhythm: Normal rate and regular rhythm.     Pulses:          Radial pulses are 2+ on the right side and 2+ on the left side.  Pulmonary:     Effort: Pulmonary effort is normal. No respiratory distress.     Breath sounds: Normal breath sounds. No wheezing, rhonchi or rales.  Abdominal:      General: There is no distension.     Palpations: Abdomen is soft.     Tenderness: There is no abdominal tenderness.  Musculoskeletal:     Cervical back: Normal range of motion and neck supple.     Comments: Upper extremities: No obvious deformity, appreciable swelling, edema, erythema, ecchymosis, warmth, or open wounds. Patient has intact AROM throughout with the exception of limitation in right shoulder flexion and abduction, able to move to about 90 degrees with these motions, limited otherwise secondary to pain.  Patient is tender to palpation over the right supraspinatus muscle as well as the anterior portion of the glenohumeral joint.  Otherwise upper extremities are nontender.  She does have pain with right upper extremity empty can test.  Skin:    General: Skin is warm and dry.     Capillary Refill: Capillary refill takes less than 2 seconds.     Findings: No rash.  Neurological:     Mental Status: She is alert.     Comments: Alert. Clear speech. Sensation grossly intact to bilateral upper extremities. 5/5 symmetric grip strength. Ambulatory.  Able to perform okay sign, thumbs up, cross second/third digits bilaterally.  Psychiatric:        Mood and Affect: Mood normal.        Behavior: Behavior normal.     ED Results / Procedures / Treatments   Labs (all labs ordered are listed, but only abnormal results are displayed) Labs Reviewed - No data to display  EKG None  Radiology DG Shoulder Right  Result Date: 10/31/2019 CLINICAL DATA:  Right shoulder pain EXAM: RIGHT SHOULDER - 2+ VIEW COMPARISON:  None. FINDINGS: There is no evidence of fracture or dislocation. Mild arthropathy of the [facility] joint. Glenohumeral joint appears within normal limits. Soft tissues are unremarkable. IMPRESSION: No acute fracture or dislocation. Mild arthropathy of the [facility] joint. Electronically Signed   By: Duanne Guess D.O.   On: 10/31/2019 16:56    Procedures Procedures (including critical care  time)  Medications Ordered in ED Medications - No data to display  ED Course  I have reviewed the triage vital signs and the nursing notes.  Pertinent labs & imaging results that were available during my care of the patient were reviewed by me and considered in my medical decision making (see chart for details).    MDM Rules/Calculators/A&P  Patient presents to the emergency department with complaints of right shoulder pain with acute onset after lifting her child.  Nontoxic, resting comfortably, vitals WNL other than elevated BP- hx of HTN, asymptomatic in this regard, doubt HTN emergency, will need PCP recheck.  No signs of infection.  X-ray without acute fracture or dislocation.  Neurovascularly intact distally.  Potential of rotator cuff injury.  Will trial naproxen and Robaxin with strengthening exercises and orthopedics follow-up. I discussed results, treatment plan, need for follow-up, and return precautions with the patient. Provided opportunity for questions, patient confirmed understanding and is in agreement with plan. Findings and plan of care discussed with supervising physician Dr. Karle Starch who is in agreement.   Final Clinical Impression(s) / ED Diagnoses Final diagnoses:  Acute pain of right shoulder  Hypertension, unspecified type    Rx / DC Orders ED Discharge Orders         Ordered    naproxen (NAPROSYN) 500 MG tablet  2 times daily     10/31/19 1940    methocarbamol (ROBAXIN) 500 MG tablet  Every 8 hours PRN     10/31/19 1940           Amaryllis Dyke, PA-C 10/31/19 1942    Truddie Hidden, MD 10/31/19 2117

## 2019-10-31 NOTE — ED Triage Notes (Signed)
Pt reports that she picked up her son approx an hour ago and head a tear in her right shoulder. Elevated BP in triage

## 2020-11-03 ENCOUNTER — Encounter: Payer: Self-pay | Admitting: *Deleted

## 2021-01-26 ENCOUNTER — Other Ambulatory Visit: Payer: Self-pay | Admitting: Urology

## 2021-01-28 ENCOUNTER — Encounter: Payer: Medicaid Other | Attending: Family Medicine | Admitting: Dietician

## 2021-01-28 ENCOUNTER — Other Ambulatory Visit: Payer: Self-pay

## 2021-01-28 ENCOUNTER — Encounter: Payer: Self-pay | Admitting: Dietician

## 2021-01-28 VITALS — Ht 67.0 in | Wt 352.4 lb

## 2021-01-28 DIAGNOSIS — I10 Essential (primary) hypertension: Secondary | ICD-10-CM | POA: Insufficient documentation

## 2021-01-28 DIAGNOSIS — E1165 Type 2 diabetes mellitus with hyperglycemia: Secondary | ICD-10-CM | POA: Insufficient documentation

## 2021-01-28 DIAGNOSIS — Z6841 Body Mass Index (BMI) 40.0 and over, adult: Secondary | ICD-10-CM | POA: Diagnosis present

## 2021-01-28 NOTE — Patient Instructions (Addendum)
Great job on beginning to walk the track!! Every time you are physically active, you are lowering your blood sugar!  Restart a prenatal vitamin  Take your metformin AFTER your breakfast meal  Check your blood sugar each day after you wake up, before you eat. Look for numbers under 125 consistently. Write these numbers down and begin to look for trends and reasons your numbers may be different (diet, exercise, stress, etc.)  Work towards having three bottles of water every other day until you are to consistently have 3 daily.  Work towards eating three meals a day, about 5-6 hours apart!  Begin to recognize carbohydrates in your food choices!  Begin to build your meals using the proportions of the Balanced Plate. . First, select your carb/starch/grain choice(s) for the meal . Next, select your source of protein to pair with your carb/starch/grain choice(s). . Finally, complete the remaining half of your meal with a variety of non-starchy vegetables.

## 2021-01-28 NOTE — Progress Notes (Signed)
Medical Nutrition Therapy  Appointment Start time:  [phone]  Appointment End time:  0915  Primary concerns today: Glycemic control, dietary advice  Referral diagnosis: E11.65 Type 2 diabetes mellitus with hyperglycemia, without long-term current insulin use, Z68.43 BMI 50.0-59.9, adult Preferred learning style: No preference indicated Learning readiness: Change in progress   NUTRITION ASSESSMENT   Anthropometrics  Ht: 5'7" Wt: 352.4 Body mass index is 55.19 kg/m.   Clinical Medical Hx: GERD, HTN, T2DM, kidney stones, PCOS Medications: metformin, glipizide Labs: A1c - 10.7 Notable Signs/Symptoms: Acanthosis on the neck, fluid retention in lower legs  Lifestyle & Dietary Hx Pt reports glipizide has been dropping their BG dramatically, as low as 57 after taking it. Pt reports their doctor cut their dose in half, but BG still falls. Pt feels shaky, weakness, lightheaded when BG drops. Pt drinks about 4 oz of orange juice to bring sugar back up. Pt states they have poor eating habits which has led to weight gain and progression of diabetes. Pt works third shift, so they are irregular with checking BG. Pt might check after work, or when they wake up in the evening. Bg numbers range between 160 to 170, highest of 214 Pt had covid in February that required a course of steroids as treatment. Pt is checking their feet daily. Pt has begun walking the track after work, early in the morning after work for 30 minutes, 2-3 days a week. Pt reports this helps reduce fluid retention, breathing, and passing more urine volume. Pt has a 14mm kidney stone to be surgically removed in 03/14/23, has passed ~12 kidney stones in the past. Pt states their mother and father have history of kidney stones. Pt would like nutrition information regarding kidney stones.  Estimated daily fluid intake: 64 oz Supplements: Vitamin D Sleep: 3-4 hours due to busy schedule Stress / self-care: moderate Current average weekly  physical activity: 30 minutes of walking 3 days a week.  24-Hr Dietary Recall First Meal: 2 fried eggs, piece of whet bread, 1/2 sasuage link Snack: none Second Meal: Cheeseburger, small fries, lemonade, bottle of water Snack: none Third Meal: Chicken and broccoli alfredo Snack: bacon and ranch potato skins, m&m's Beverages: water, lemonade   NUTRITION DIAGNOSIS  NB-1.1 Food and nutrition-related knowledge deficit As related to diabetes.  As evidenced by A1c of 10.7, limited physical activity, and dietary history of skipping meals and over consumption of carbohydrate snacks.   NUTRITION INTERVENTION  Nutrition education (E-1) on the following topics:  Educated patient on the pathophysiology of diabetes. This includes why our bodies need circulating blood sugar, the relationship between insulin and blood sugar, and the results of insulin resistance and/or pancreatic insufficiency on the development of diabetes. Educated patient on factors that contribute to elevation of blood sugars, such as stress, illness, injury,and food choices. Discussed the role that physical activity plays in lowering blood sugar. Educate patient on the three main macronutrients. Protein, fats, and carbohydrates. Discussed how each of these macronutrients affect blood sugar levels, especially carbohydrate, and the importance of eating a consistent amount of carbohydrate throughout the day. Educated patient on the "Rule of 15". Treat low blood sugar, below 70 mg/dL, by eating 14N of fast acting carbohydrate and rechecking blood sugar after 15 minutes. If blood sugar is still low, repeat. Discussed the role of diabetes medications in getting blood sugar under control, and considerations that may need to be taken to avoid complications. Educated patient on the need to consider lows while taking glipizide and  exercising.  Handouts Provided Include   Balanced Plate  Balanced Snacks  Kidney Stones Nutrition Therapy  Nutrition Care Manual  Learning Style & Readiness for Change Teaching method utilized: Visual & Auditory  Demonstrated degree of understanding via: Teach Back  Barriers to learning/adherence to lifestyle change: none  Goals Established by Pt  Great job on beginning to walk the track!! Every time you are physically active, you are lowering your blood sugar!  Restart a prenatal vitamin  Take your metformin AFTER your breakfast meal  Check your blood sugar each day after you wake up, before you eat. Look for numbers under 125 consistently. Write these numbers down and begin to look for trends and reasons your numbers may be different (diet, exercise, stress, etc.)  Work towards having three bottles of water every other day until you are to consistently have 3 daily.  Work towards eating three meals a day, about 5-6 hours apart!  Begin to recognize carbohydrates in your food choices!  Begin to build your meals using the proportions of the Balanced Plate.  First, select your carb/starch/grain choice(s) for the meal  Next, select your source of protein to pair with your carb/starch/grain choice(s).  Finally, complete the remaining half of your meal with a variety of non-starchy vegetables.   MONITORING & EVALUATION Dietary intake, weekly physical activity, and blood glucose numbers in 6 weeks.  Next Steps  Patient is to follow up with RD.

## 2021-02-18 NOTE — Patient Instructions (Addendum)
DUE TO COVID-19 ONLY ONE VISITOR IS ALLOWED TO COME WITH YOU AND STAY IN THE WAITING ROOM ONLY DURING PRE OP AND PROCEDURE DAY OF SURGERY. THE 1 VISITOR  Taylor VISIT WITH YOU AFTER SURGERY IN YOUR PRIVATE ROOM DURING VISITING HOURS ONLY!                Tammy Taylor     Your procedure is scheduled on: 03/04/21   Report to [facility] Main  Entrance   Report to short stay at 5:15 AM     Call this number if you have problems the morning of surgery [phone]    Remember: Do not eat food or drink liquids :After Midnight.   BRUSH YOUR TEETH MORNING OF SURGERY AND RINSE YOUR MOUTH OUT, NO CHEWING GUM CANDY OR MINTS.     Take these medicines the morning of surgery with A SIP OF WATER: Tamsulosin  DO NOT TAKE ANY DIABETIC MEDICATIONS DAY OF YOUR SURGERY How to Manage Your Diabetes Before and After Surgery  Why is it important to control my blood sugar before and after surgery? Improving blood sugar levels before and after surgery helps healing and can limit problems. A way of improving blood sugar control is eating a healthy diet by:  Eating less sugar and carbohydrates  Increasing activity/exercise  Talking with your doctor about reaching your blood sugar goals High blood sugars (greater than 180 mg/dL) can raise your risk of infections and slow your recovery, so you will need to focus on controlling your diabetes during the weeks before surgery. Make sure that the doctor who takes care of your diabetes knows about your planned surgery including the date and location.  How do I manage my blood sugar before surgery? Check your blood sugar at least 4 times a day, starting 2 days before surgery, to make sure that the level is not too high or low. Check your blood sugar the morning of your surgery when you wake up and every 2 hours until you get to the Short Stay unit. If your blood sugar is less than 70 mg/dL, you will need to treat for low blood sugar: Do not take  insulin. Treat a low blood sugar (less than 70 mg/dL) with  cup of clear juice (cranberry or apple), 4 glucose tablets, OR glucose gel. Recheck blood sugar in 15 minutes after treatment (to make sure it is greater than 70 mg/dL). If your blood sugar is not greater than 70 mg/dL on recheck, call [phone] for further instructions. Report your blood sugar to the short stay nurse when you get to Short Stay.  If you are admitted to the hospital after surgery: Your blood sugar will be checked by the staff and you will probably be given insulin after surgery (instead of oral diabetes medicines) to make sure you have good blood sugar levels. The goal for blood sugar control after surgery is 80-180 mg/dL.   WHAT DO I DO ABOUT MY DIABETES MEDICATION?  Do not take oral diabetes medicines (pills) the morning of surgery.                                   You Taylor not have any metal on your body including hair pins and              piercings  Do not wear jewelry, make-up, lotions, powders or perfumes, deodorant  Do not wear nail polish on your fingernails.  Do not shave  48 hours prior to surgery.                Do not bring valuables to the hospital. [place] IS NOT             RESPONSIBLE   FOR VALUABLES.  Contacts, dentures or bridgework Taylor not be worn into surgery.       Patients discharged the day of surgery will not be allowed to drive home.   IF YOU ARE HAVING SURGERY AND GOING HOME THE SAME DAY, YOU MUST HAVE AN ADULT TO DRIVE YOU HOME AND BE WITH YOU FOR 24 HOURS.   YOU Taylor GO HOME BY TAXI OR UBER OR ORTHERWISE, BUT AN ADULT MUST ACCOMPANY YOU HOME AND STAY WITH YOU FOR 24 HOURS.  Name and phone number of your driver:  Special Instructions: N/A              Please read over the following fact sheets you were given: _____________________________________________________________________             [facility] - Preparing for Surgery Before surgery, you can play an  important role.  Because skin is not sterile, your skin needs to be as free of germs as possible.  You can reduce the number of germs on your skin by washing with CHG (chlorahexidine gluconate) soap before surgery.  CHG is an antiseptic cleaner which kills germs and bonds with the skin to continue killing germs even after washing. Please DO NOT use if you have an allergy to CHG or antibacterial soaps.  If your skin becomes reddened/irritated stop using the CHG and inform your nurse when you arrive at Short Stay. Do not shave (including legs and underarms) for at least 48 hours prior to the first CHG shower.  Please follow these instructions carefully:  1.  Shower with CHG Soap the night before surgery and the  morning of Surgery.  2.  If you choose to wash your hair, wash your hair first as usual with your  normal  shampoo.  3.  After you shampoo, rinse your hair and body thoroughly to remove the  shampoo.                                        4.  Use CHG as you would any other liquid soap.  You can apply chg directly  to the skin and wash                       Gently with a scrungie or clean washcloth.  5.  Apply the CHG Soap to your body ONLY FROM THE NECK DOWN.   Do not use on face/ open                           Wound or open sores. Avoid contact with eyes, ears mouth and genitals (private parts).                       Wash face,  Genitals (private parts) with your normal soap.             6.  Wash thoroughly, paying special attention to the area where your surgery  will be performed.  7.  Thoroughly  rinse your body with warm water from the neck down.  8.  DO NOT shower/wash with your normal soap after using and rinsing off  the CHG Soap.             9.  Pat yourself dry with a clean towel.            10.  Wear clean pajamas.            11.  Place clean sheets on your bed the night of your first shower and do not  sleep with pets. Day of Surgery : Do not apply any lotions/deodorants the  morning of surgery.  Please wear clean clothes to the hospital/surgery center.  FAILURE TO FOLLOW THESE INSTRUCTIONS Taylor RESULT IN THE CANCELLATION OF YOUR SURGERY PATIENT SIGNATURE_________________________________  NURSE SIGNATURE__________________________________  ________________________________________________________________________

## 2021-02-21 ENCOUNTER — Other Ambulatory Visit: Payer: Self-pay

## 2021-02-21 ENCOUNTER — Encounter (HOSPITAL_COMMUNITY): Payer: Self-pay

## 2021-02-21 ENCOUNTER — Encounter (HOSPITAL_COMMUNITY)
Admission: RE | Admit: 2021-02-21 | Discharge: 2021-02-21 | Disposition: A | Discharge status: Home / Self Care | Payer: Medicaid Other | Source: Ambulatory Visit | Attending: Urology | Admitting: Urology

## 2021-02-21 DIAGNOSIS — Z01818 Encounter for other preprocedural examination: Secondary | ICD-10-CM | POA: Insufficient documentation

## 2021-02-21 HISTORY — DX: Sleep apnea, unspecified: G47.30

## 2021-02-21 HISTORY — DX: Personal history of urinary calculi: Z87.442

## 2021-02-21 HISTORY — DX: Gastro-esophageal reflux disease without esophagitis: K21.9

## 2021-02-21 LAB — SURGICAL PCR SCREEN
MRSA, PCR: NEGATIVE
Staphylococcus aureus: NEGATIVE

## 2021-02-21 LAB — GLUCOSE, CAPILLARY: Glucose-Capillary: 154 mg/dL — ABNORMAL HIGH (ref 70–99)

## 2021-02-21 NOTE — Progress Notes (Addendum)
COVID Vaccine Completed:No Date COVID Vaccine completed: COVID vaccine manufacturer: Pfizer    Moderna   [employer]   PCP - Dr. Samuella Cota LOV 02/07/21 Cardiologist - none- cardiac test were done in 2021 at [facility] in [place] for a fluttering in the chest. Records were requested 02/21/21  Chest x-ray - no EKG - 02/21/21-chart Stress Test - Pt not sure ECHO - Pt not sure Cardiac Cath - no Pacemaker/ICD device last checked:NA  Sleep Study - Pt is due to have a study done. Stop bang 5  CPAP - no  Fasting Blood Sugar - 150-180 Checks Blood Sugar _QD____ times a day  Blood Thinner Instructions:NA Aspirin Instructions: Last Dose:  Anesthesia review: yes  Patient denies shortness of breath, fever, cough and chest pain at PAT appointment Yes Pt get SOB while walking and can climb one flight of stairs. She is 352 lbs with a BMI of 55.1. This is her first surgery and she is scared.  Patient verbalized understanding of instructions that were given to them at the PAT appointment. Patient was also instructed that they will need to review over the PAT instructions again at home before surgery. yes

## 2021-02-22 LAB — HEMOGLOBIN A1C
Hgb A1c MFr Bld: 7.5 % — ABNORMAL HIGH (ref 4.8–5.6)
Mean Plasma Glucose: 169 mg/dL

## 2021-03-03 NOTE — Discharge Instructions (Addendum)
[facility] [phone] Post Ureteroscopy With or Without Stent Instructions  Definitions:  Ureter: The duct that transports urine from the kidney to the bladder. Stent:   A plastic hollow tube that is placed into the ureter, from the kidney to the bladder to prevent the ureter from swelling shut.  GENERAL INSTRUCTIONS:  Despite the fact that no skin incisions were used, the area around the ureter and bladder is raw and irritated. The stent is a foreign body which will further irritate the bladder wall. This irritation is manifested by increased frequency of urination, both day and night, and by an increase in the urge to urinate. In some, the urge to urinate is present almost always. Sometimes the urge is strong enough that you may not be able to stop yourself from urinating. The only real cure is to remove the stent and then give time for the bladder wall to heal which can't be done until the danger of the ureter swelling shut has passed, which varies.  You may see some blood in your urine while the stent is in place and a few days afterwards. Do not be alarmed, even if the urine was clear for a while. Get off your feet and drink lots of fluids until clearing occurs. If you start to pass clots or don't improve, call us.  DIET: You may return to your normal diet immediately. Because of the raw surface of your bladder, alcohol, spicy foods, acid type foods and drinks with caffeine may cause irritation or frequency and should be used in moderation. To keep your urine flowing freely and to avoid constipation, drink plenty of fluids during the day ( 8-10 glasses ). Tip: Avoid cranberry juice because it is very acidic.  ACTIVITY: Your physical activity doesn't need to be restricted. However, if you are very active, you may see some blood in your urine. We suggest that you reduce your activity under these circumstances until the bleeding has stopped.  BOWELS: It is important to  keep your bowels regular during the postoperative period. Straining with bowel movements can cause bleeding. A bowel movement every other day is reasonable. Use a mild laxative if needed, such as Milk of Magnesia 2-3 tablespoons, or 2 Dulcolax tablets. Call if you continue to have problems. If you have been taking narcotics for pain, before, during or after your surgery, you may be constipated. Take a laxative if necessary.   MEDICATION: You should resume your pre-surgery medications unless told not to. In addition you will often be given an antibiotic to prevent infection. These should be taken as prescribed until the bottles are finished unless you are having an unusual reaction to one of the drugs.  PROBLEMS YOU SHOULD REPORT TO [place]: Fevers over 100.5 Fahrenheit. Heavy bleeding, or clots ( See above notes about blood in urine ). Inability to urinate. Drug reactions ( hives, rash, nausea, vomiting, diarrhea ). Severe burning or pain with urination that is not improving.  FOLLOW-UP: You will need a follow-up appointment to monitor your progress. Call for this appointment at the number listed above. Usually the first appointment will be about three to fourteen days after your surgery.  Followup in office in one week for stent removal.

## 2021-03-03 NOTE — H&P (Signed)
Tammy Taylor  MRN: [id number]  DOB: 1981-07-13, 40 year old Female  SSN:    PRIMARY CARE:  [facility]  REFERRING:  Sundra AlandAlethea Rucker, MD  PROVIDER:  Jettie PaganMatthew Chaniqua Brisby, M.D.  LOCATION:  [facility] [phone]     --------------------------------------------------------------------------------   CC/HPI: Tammy DroughtShannon Taylor is a 40 year old female seen in consultation today for bilateral renal stones.   Of note, she has morbid obesity with BMI of 54.   CT A/P 12/13/2020 revealed 1.4 cm stone in the left renal pelvis with slight urothelial thickening with mild pelvicalyceal dilatation. Also present were bilateral nonobstructive stones in the lower poles (my read: 1.4 cm left renal pelvis stone, 5 mm left posterior lower pole stone, 1 cm left lower pole stone; 12 mm right lower pole stone; skin to stone distance approximately 20 cm, Hounsfield units 560).   She reports having mild intermittent left flank pain that is well controlled. This is been stable for several years. She does report a long history of urolithiasis and estimates medical expulsive therapy episodes approximately 12. She states that [place] [facility] [place] [facility] [place] who stated that she did not require a surgical intervention. She has not had any surgical interventions for her stones.   She denies recurrent urinary tract infections.   She does have a past medical history of morbid obesity (54 BMI), HTN, DM, and GERD.     ALLERGIES: Lisinopril    MEDICATIONS: Metformin Hcl 500 mg tablet  Metoprolol Succinate 50 mg tablet, extended release 24 hr  Tamsulosin Hcl 0.4 mg capsule  Amlodipine Besylate 10 mg tablet  Flonase Allergy Relief  Furosemide 20 mg tablet  Glipizide 10 mg tablet  Losartan Potassium 100 mg tablet  Multivitamin  Vitamin D2     GU PSH: None   NON-GU PSH: None   GU PMH: None   NON-GU PMH: Diabetes Type 2 GERD Hypertension    FAMILY HISTORY: 1 son - Runs in  Family Breast Cancer - Runs in Family Colon Cancer - Runs in Family Kidney Failure - Runs in Family Kidney Stones - Runs in Family   SOCIAL HISTORY: Marital Status: Single Ethnicity: Not Hispanic Or Latino; Race: Black or African American Current Smoking Status: Patient has never smoked.   Tobacco Use Assessment Completed: Used Tobacco in last 30 days? Has never drank.  Does not drink caffeine.    REVIEW OF SYSTEMS:    GU Review Female:   Patient reports stream starts and stops. Patient denies frequent urination, hard to postpone urination, burning /pain with urination, get up at night to urinate, leakage of urine, trouble starting your stream, have to strain to urinate, and being pregnant.  Gastrointestinal (Upper):   Patient reports nausea and indigestion/ heartburn. Patient denies vomiting.  Gastrointestinal (Lower):   Patient denies diarrhea and constipation.  Constitutional:   Patient denies fever, night sweats, weight loss, and fatigue.  Skin:   Patient denies skin rash/ lesion and itching.  Eyes:   Patient denies blurred vision and double vision.  Ears/ Nose/ Throat:   Patient reports sinus problems. Patient denies sore throat.  Hematologic/Lymphatic:   Patient denies swollen glands and easy bruising.  Cardiovascular:   Patient reports leg swelling. Patient denies chest pains.  Respiratory:   Patient denies cough and shortness of breath.  Endocrine:   Patient denies excessive thirst.  Musculoskeletal:   Patient reports back pain and joint pain.   Neurological:   Patient denies headaches and dizziness.  Psychologic:   Patient denies depression and anxiety.   VITAL SIGNS:      01/13/2021 10:41 AM  Weight 346 lb / 156.94 kg  Height 67 in / 170.18 cm  BP 165/72 mmHg  Pulse 87 /min  Temperature 97.8 F / 36.5 C  BMI 54.2 kg/m   MULTI-SYSTEM PHYSICAL EXAMINATION:    Constitutional: Well-nourished. No physical deformities. Normally developed. Good grooming.  Respiratory: No  labored breathing, no use of accessory muscles.   Cardiovascular: Normal temperature, normal extremity pulses, no swelling, no varicosities.  Gastrointestinal: No mass, no tenderness, no rigidity, morbid obesity, No CVA tenderness     Complexity of Data:  Source Of History:  Patient, Medical Record Summary  Records Review:   Previous Doctor Records, Previous Patient Records  Urine Test Review:   Urinalysis  X-Ray Review: C.T. Abdomen/Pelvis: Reviewed Films. Reviewed Report. Discussed With Patient.    Notes:                     CLINICAL DATA: Passed kidney stone 1 week prior, 2 days after urine  became dark in noticed hematuria now with back pain radiating to  lower abdomen and dysuria   EXAM:  CT ABDOMEN AND PELVIS WITHOUT CONTRAST   TECHNIQUE:  Multidetector CT imaging of the abdomen and pelvis was performed  following the standard protocol without IV contrast.   COMPARISON: CT abdomen pelvis 07/31/2020   FINDINGS:  Lower chest: Lung bases are clear. Normal heart size. No pericardial  effusion.   Hepatobiliary: No worrisome focal liver abnormality is visible  within the limitations of an unenhanced CT. Smooth liver surface  contour. Normal hepatic attenuation. Normal gallbladder. No visible  calcified gallstones. No biliary ductal dilatation.   Pancreas: No pancreatic ductal dilatation or surrounding  inflammatory changes.   Spleen: Normal in size. No concerning splenic lesions.   Adrenals/Urinary Tract: Normal adrenals. The kidneys are symmetric  in size and normally located. Some mild asymmetric pelvic calyceal  dilatation of the left kidney is noted with migration of a 14 mm  calculus into the renal pelvis/ureteropelvic junction of the left  kidney with some mild urothelial thickening. Calculus was previously  located more peripherally in the lower pole calyx on the comparison  exam. Additional nonobstructing calculi present in both lower poles.  No right urinary  tract dilatation. No distal ureteral calculi.  Urinary bladder is largely decompressed at the time of exam and  therefore poorly evaluated by CT imaging. No gross bladder  abnormality, bladder calculi or debris.   Stomach/Bowel: Distal esophagus, stomach and duodenal sweep are  unremarkable. No small bowel wall thickening or dilatation. No  evidence of obstruction. A normal appendix is visualized. No colonic  dilatation or wall thickening.   Vascular/Lymphatic: No significant vascular findings are present. No  enlarged abdominal or pelvic lymph nodes.   Reproductive: Slightly retroverted uterus. Stable positioning of the  radiopaque IUD accounting for differences in retroversion. No  concerning adnexal lesions.   Other: No abdominopelvic free fluid or free gas. No bowel containing  hernias.   Musculoskeletal: Chronic left L5 pars defect. No acute osseous  abnormality or suspicious osseous lesion. Some multilevel  degenerative changes are maximal at the L4-L5 and L5-S1 levels with  bilateral foraminal narrowing.   IMPRESSION:  1. Mild asymmetric pelvocalyceal dilatation of the left kidney is  noted with migration of a 14 mm calculus into the renal  pelvis/ureteropelvic junction of the left kidney with slight  urothelial thickening.  Calculus was previously located more  peripherally in the lower pole calyx on the comparison exam.  Additional nonobstructing calculi present in both lower poles.  2. Additional bilateral nonobstructing nephrolithiasis.  3. Chronic left L5 pars defect.    Electronically Signed  By: Kreg Shropshire M.D.  On: 12/13/2020 01:57   PROCEDURES:          Urinalysis w/Scope Dipstick Dipstick Cont'd Micro  Color: Yellow Bilirubin: Neg mg/dL WBC/hpf: 0 - 5/hpf  Appearance: Cloudy Ketones: Neg mg/dL RBC/hpf: >65/HQI  Specific Gravity: 1.025 Blood: 3+ ery/uL Bacteria: Rare (0-9/hpf)  pH: 7.0 Protein: 1+ mg/dL Cystals: NS (Not Seen)  Glucose: Neg mg/dL  Urobilinogen: 0.2 mg/dL Casts: NS (Not Seen)    Nitrites: Neg Trichomonas: Not Present    Leukocyte Esterase: Trace leu/uL Mucous: Not Present      Epithelial Cells: 0 - 5/hpf      Yeast: NS (Not Seen)      Sperm: Not Present    ASSESSMENT:      ICD-10 Details  1 GU:   Renal calculus - N20.0   2   Flank Pain - R10.84    PLAN:           Orders Labs CULTURE, URINE          Document Letter(s):  Created for Patient: Clinical Summary         Notes:   #1. Bilateral renal stones:  -CT A/P 12/13/2020 with 1.4 cm left renal pelvis stone, additional 5 mm left lower pole stone and 1 cm left lower pole stone; 12 mm right lower pole stone  -She is asymptomatic from her left renal pelvis stone however no significant hydronephrosis. No fevers, chills, sign of infection  -I discussed surgical options as below including staged ureteroscopy with laser lithotripsy, ESWL, PCNL. We did discuss that given her morbid obesity and skin to stone distance of approximately 20 cm, ESWL would be unlikely to achieve adequate fragmentation. I also discussed that a PCNL would be extremely challenging and we would have to obtain longer sheaths in order to adequately reach her renal pelvis. We also discussed the higher risk of complications given this longer distance. As such, I favored staged ureteroscopy with laser lithotripsy to clear her stone burden. She is aware that we may not be able to clear her entire stone burden on the left side in 1 setting. She is amenable to proceed with cystoscopy, left retrograde pyelogram, left ureteroscopy with laser lithotripsy, left ureteral stent placement.  -We will send urine for culture preoperatively and treat if positive.  -We will plan for metabolic evaluation after treating her stone.   We discussed the options for management of kidney stones, including observation, ESWL, ureteroscopy with laser lithotripsy, and PCNL. The risks and benefits of each option were discussed.  For  observation I described the risks which include but are not limited to silent renal damage, life-threatening infection, need for emergent surgery, failure to pass stone, and pain.   ESWL: risks and benefits of ESWL were outlined including infection, bleeding, pain, steinstrasse, kidney injury, need for ancillary treatments, and global anesthesia risks including but not limited to CVA, MI, DVT, PE, pneumonia, and death.   Ureteroscopy: risks and benefits of ureteroscopy were outlined, including infection, bleeding, pain, temporary ureteral stent and associated stent bother, ureteral injury, ureteral stricture, need for ancillary treatments, and global anesthesia risks including but not limited to CVA, MI, DVT, PE, pneumonia, and death.   PCNL: risks and benefits of PCNL  were outlined including infection, bleeding, blood transfusion, pain, pneumothorax, bowel injury, persistent urine leak, positioning injury, inability to clear stone burden, renal laceration, arterial venous fistula or malformation, need for ancillary treatments, and global anesthesia risks including but not limited to CVA, MI, DVT, PE, pneumonia, and death.   We discussed dietary methods for stone prevention including the following: increased water intake to 2-3 liters per day, add lemon or lemon concentrate to water to increase citrate which is beneficial for stone prevention, limiting dietary sodium to less than 2000 mg per day, limiting animal protein to less than 2 servings (16 ounces/day), and limiting foods high in oxalate content (spinach, beans, chocolate, etc.).    #2. Left flank pain: This is likely related to her left renal pelvis stone. There is no sign of obstruction and no need for urgent stent placement this time. Return precautions discussed.   CC: Sundra Aland, MD   Urology Preoperative H&P   Chief Complaint: Bilateral renal stones  History of Present Illness: CHAUNTELLE AZPEITIA is a 40 y.o. female with bilateral  renal stones here for staged left URS/LL, left stent placement. Denies fevers, chills. Preop Ucx NG.    Past Medical History:  Diagnosis Date   Diabetes mellitus without complication (HCC)    Dysrhythmia 2021   fluttering   GERD (gastroesophageal reflux disease)    History of kidney stones    Hypertension    Kidney stones    PCOS (polycystic ovarian syndrome)    Sleep apnea     No past surgical history on file.  Allergies:  Allergies  Allergen Reactions   Lisinopril Cough and Other (See Comments)    cough     Family History  Problem Relation Age of Onset   Diabetes Father    Cancer Paternal Aunt     Social History:  reports that she has never smoked. She has never used smokeless tobacco. She reports that she does not drink alcohol and does not use drugs.  ROS: A complete review of systems was performed.  All systems are negative except for pertinent findings as noted.  Physical Exam:  Vital signs in last 24 hours:   Constitutional:  Alert and oriented, No acute distress Cardiovascular: Regular rate and rhythm Respiratory: Normal respiratory effort, Lungs clear bilaterally GI: Abdomen is soft, nontender, nondistended, no abdominal masses GU: No CVA tenderness Lymphatic: No lymphadenopathy Neurologic: Grossly intact, no focal deficits Psychiatric: Normal mood and affect  Laboratory Data:  No results for input(s): WBC, HGB, HCT, PLT in the last 72 hours.  No results for input(s): NA, K, CL, GLUCOSE, BUN, CALCIUM, CREATININE in the last 72 hours.  Invalid input(s): CO3   No results found for this or any previous visit (from the past 24 hour(s)). No results found for this or any previous visit (from the past 240 hour(s)).  Renal Function: No results for input(s): CREATININE in the last 168 hours. CrCl cannot be calculated (Patient's most recent lab result is older than the maximum 21 days allowed.).  Radiologic Imaging: No results found.  I independently  reviewed the above imaging studies.  Assessment and Plan RILEIGH KAWASHIMA is a 40 y.o. female with bilateral renal stones here for staged left URS/LL, left stent placement. Preop Ucx NG.   -The risks, benefits and alternatives of cystoscopy with left URS/LL, Left JJ stent placement was discussed with the patient.  Risks include, but are not limited to: bleeding, urinary tract infection, ureteral injury, ureteral stricture disease, chronic pain,  urinary symptoms, bladder injury, stent migration, the need for nephrostomy tube placement, MI, CVA, DVT, PE and the inherent risks with general anesthesia.  The patient voices understanding and wishes to proceed.   Matt R. Peaches Vanoverbeke MD 03/03/2021, 12:05 PM  [facility] Pager: [phone]

## 2021-03-03 NOTE — Anesthesia Preprocedure Evaluation (Addendum)
Anesthesia Evaluation  Patient identified by MRN, date of birth, ID band Patient awake    Reviewed: Allergy & Precautions, NPO status , Patient's Chart, lab work & pertinent test results  History of Anesthesia Complications Negative for: history of anesthetic complications  Airway Mallampati: II  TM Distance: >3 FB Neck ROM: Full    Dental no notable dental hx.    Pulmonary sleep apnea ,    Pulmonary exam normal        Cardiovascular hypertension, Pt. on medications Normal cardiovascular exam     Neuro/Psych negative neurological ROS  negative psych ROS   GI/Hepatic Neg liver ROS, GERD  ,  Endo/Other  diabetes, Type 2, Oral Hypoglycemic AgentsMorbid obesity  Renal/GU Renal stones  negative genitourinary   Musculoskeletal negative musculoskeletal ROS (+)   Abdominal   Peds  Hematology negative hematology ROS (+)   Anesthesia Other Findings Day of surgery medications reviewed with patient.  Reproductive/Obstetrics negative OB ROS                            Anesthesia Physical Anesthesia Plan  ASA: 3  Anesthesia Plan: General   Post-op Pain Management:    Induction: Intravenous  PONV Risk Score and Plan: 3 and Treatment may vary due to age or medical condition, Ondansetron, Dexamethasone, Midazolam and Scopolamine patch - Pre-op  Airway Management Planned: Oral ETT  Additional Equipment: None  Intra-op Plan:   Post-operative Plan: Extubation in OR  Informed Consent: I have reviewed the patients History and Physical, chart, labs and discussed the procedure including the risks, benefits and alternatives for the proposed anesthesia with the patient or authorized representative who has indicated his/her understanding and acceptance.     Dental advisory given  Plan Discussed with: CRNA  Anesthesia Plan Comments:        Anesthesia Quick Evaluation

## 2021-03-04 ENCOUNTER — Ambulatory Visit (HOSPITAL_COMMUNITY): Payer: Medicaid Other | Admitting: Physician Assistant

## 2021-03-04 ENCOUNTER — Encounter (HOSPITAL_COMMUNITY)
Admission: RE | Disposition: A | Discharge status: Home / Self Care | Payer: Self-pay | Source: Home / Self Care | Attending: Urology

## 2021-03-04 ENCOUNTER — Ambulatory Visit (HOSPITAL_COMMUNITY): Payer: Medicaid Other | Admitting: Certified Registered"

## 2021-03-04 ENCOUNTER — Ambulatory Visit (HOSPITAL_COMMUNITY): Payer: Medicaid Other

## 2021-03-04 ENCOUNTER — Encounter (HOSPITAL_COMMUNITY): Payer: Self-pay | Admitting: Urology

## 2021-03-04 ENCOUNTER — Ambulatory Visit (HOSPITAL_COMMUNITY)
Admission: RE | Admit: 2021-03-04 | Discharge: 2021-03-04 | Disposition: A | Discharge status: Home / Self Care | Payer: Medicaid Other | Attending: Urology | Admitting: Urology

## 2021-03-04 DIAGNOSIS — I10 Essential (primary) hypertension: Secondary | ICD-10-CM | POA: Insufficient documentation

## 2021-03-04 DIAGNOSIS — K219 Gastro-esophageal reflux disease without esophagitis: Secondary | ICD-10-CM | POA: Insufficient documentation

## 2021-03-04 DIAGNOSIS — Z888 Allergy status to other drugs, medicaments and biological substances status: Secondary | ICD-10-CM | POA: Diagnosis not present

## 2021-03-04 DIAGNOSIS — Z7984 Long term (current) use of oral hypoglycemic drugs: Secondary | ICD-10-CM | POA: Insufficient documentation

## 2021-03-04 DIAGNOSIS — Z87442 Personal history of urinary calculi: Secondary | ICD-10-CM | POA: Insufficient documentation

## 2021-03-04 DIAGNOSIS — Z6841 Body Mass Index (BMI) 40.0 and over, adult: Secondary | ICD-10-CM | POA: Diagnosis not present

## 2021-03-04 DIAGNOSIS — Z79899 Other long term (current) drug therapy: Secondary | ICD-10-CM | POA: Insufficient documentation

## 2021-03-04 DIAGNOSIS — N200 Calculus of kidney: Secondary | ICD-10-CM | POA: Diagnosis present

## 2021-03-04 DIAGNOSIS — E119 Type 2 diabetes mellitus without complications: Secondary | ICD-10-CM | POA: Insufficient documentation

## 2021-03-04 HISTORY — PX: CYSTOSCOPY/URETEROSCOPY/HOLMIUM LASER/STENT PLACEMENT: SHX6546

## 2021-03-04 LAB — BASIC METABOLIC PANEL
Anion gap: 10 (ref 5–15)
BUN: 11 mg/dL (ref 6–20)
CO2: 26 mmol/L (ref 22–32)
Calcium: 9.2 mg/dL (ref 8.9–10.3)
Chloride: 102 mmol/L (ref 98–111)
Creatinine, Ser: 0.86 mg/dL (ref 0.44–1.00)
GFR, Estimated: >60 mL/min (ref >60)
Glucose, Bld: 147 mg/dL — ABNORMAL HIGH (ref 70–99)
Potassium: 3.4 mmol/L — ABNORMAL LOW (ref 3.5–5.1)
Sodium: 138 mmol/L (ref 135–145)

## 2021-03-04 LAB — CBC
HCT: 41.8 % (ref 36.0–46.0)
Hemoglobin: 13.2 g/dL (ref 12.0–15.0)
MCH: 25 pg — ABNORMAL LOW (ref 26.0–34.0)
MCHC: 31.6 g/dL (ref 30.0–36.0)
MCV: 79.3 fL — ABNORMAL LOW (ref 80.0–100.0)
Platelets: 307 10*3/uL (ref 150–400)
RBC: 5.27 MIL/uL — ABNORMAL HIGH (ref 3.87–5.11)
RDW: 14.1 % (ref 11.5–15.5)
WBC: 7.4 10*3/uL (ref 4.0–10.5)
nRBC: 0 % (ref 0.0–0.2)

## 2021-03-04 LAB — GLUCOSE, CAPILLARY
Glucose-Capillary: 156 mg/dL — ABNORMAL HIGH (ref 70–99)
Glucose-Capillary: 179 mg/dL — ABNORMAL HIGH (ref 70–99)

## 2021-03-04 LAB — PREGNANCY, URINE: Preg Test, Ur: NEGATIVE

## 2021-03-04 SURGERY — CYSTOSCOPY/URETEROSCOPY/HOLMIUM LASER/STENT PLACEMENT
Anesthesia: General | Laterality: Left

## 2021-03-04 MED ORDER — ACETAMINOPHEN 500 MG PO TABS
1000.0000 mg | ORAL_TABLET | Freq: Once | ORAL | Status: AC
  Administered 2021-03-04: 1000 mg via ORAL
  Filled 2021-03-04: qty 2

## 2021-03-04 MED ORDER — DOCUSATE SODIUM 100 MG PO CAPS: 100.0000 mg | ORAL_CAPSULE | Freq: Every day | ORAL | 0 refills | Status: DC | PRN

## 2021-03-04 MED ORDER — SUGAMMADEX SODIUM 200 MG/2ML IV SOLN
INTRAVENOUS | Status: DC | PRN
  Administered 2021-03-04: 250 mg via INTRAVENOUS

## 2021-03-04 MED ORDER — ONDANSETRON HCL 4 MG/2ML IJ SOLN
INTRAMUSCULAR | Status: AC
  Filled 2021-03-04: qty 2

## 2021-03-04 MED ORDER — FENTANYL CITRATE (PF) 100 MCG/2ML IJ SOLN
INTRAMUSCULAR | Status: DC | PRN
  Administered 2021-03-04: 100 ug via INTRAVENOUS
  Administered 2021-03-04 (×2): 50 ug via INTRAVENOUS

## 2021-03-04 MED ORDER — 0.9 % SODIUM CHLORIDE (POUR BTL) OPTIME
TOPICAL | Status: DC | PRN
  Administered 2021-03-04: 1000 mL

## 2021-03-04 MED ORDER — IOHEXOL 300 MG/ML  SOLN
INTRAMUSCULAR | Status: DC | PRN
  Administered 2021-03-04: 20 mL

## 2021-03-04 MED ORDER — OXYCODONE HCL 5 MG PO TABS: 5.0000 mg | ORAL_TABLET | Freq: Once | ORAL | Status: DC | PRN

## 2021-03-04 MED ORDER — LIDOCAINE 2% (20 MG/ML) 5 ML SYRINGE
INTRAMUSCULAR | Status: AC
  Filled 2021-03-04: qty 5

## 2021-03-04 MED ORDER — CEFAZOLIN IN SODIUM CHLORIDE 3-0.9 GM/100ML-% IV SOLN
3.0000 g | Freq: Once | INTRAVENOUS | Status: AC
  Administered 2021-03-04: 3 g via INTRAVENOUS
  Filled 2021-03-04: qty 100

## 2021-03-04 MED ORDER — ROCURONIUM BROMIDE 10 MG/ML (PF) SYRINGE
PREFILLED_SYRINGE | INTRAVENOUS | Status: DC | PRN
  Administered 2021-03-04: 70 mg via INTRAVENOUS

## 2021-03-04 MED ORDER — LIDOCAINE 2% (20 MG/ML) 5 ML SYRINGE
INTRAMUSCULAR | Status: DC | PRN
  Administered 2021-03-04: 100 mg via INTRAVENOUS

## 2021-03-04 MED ORDER — OXYCODONE HCL 5 MG/5ML PO SOLN: 5.0000 mg | Freq: Once | ORAL | Status: DC | PRN

## 2021-03-04 MED ORDER — FENTANYL CITRATE (PF) 100 MCG/2ML IJ SOLN: 25.0000 ug | INTRAMUSCULAR | Status: DC | PRN

## 2021-03-04 MED ORDER — SUCCINYLCHOLINE CHLORIDE 200 MG/10ML IV SOSY
PREFILLED_SYRINGE | INTRAVENOUS | Status: AC
  Filled 2021-03-04: qty 10

## 2021-03-04 MED ORDER — PROPOFOL 10 MG/ML IV BOLUS
INTRAVENOUS | Status: DC | PRN
  Administered 2021-03-04 (×2): 50 mg via INTRAVENOUS
  Administered 2021-03-04: 200 mg via INTRAVENOUS

## 2021-03-04 MED ORDER — MIDAZOLAM HCL 5 MG/5ML IJ SOLN
INTRAMUSCULAR | Status: DC | PRN
  Administered 2021-03-04: 2 mg via INTRAVENOUS

## 2021-03-04 MED ORDER — PROPOFOL 10 MG/ML IV BOLUS
INTRAVENOUS | Status: AC
  Filled 2021-03-04: qty 40

## 2021-03-04 MED ORDER — SUGAMMADEX SODIUM 500 MG/5ML IV SOLN
INTRAVENOUS | Status: AC
  Filled 2021-03-04: qty 5

## 2021-03-04 MED ORDER — SODIUM CHLORIDE 0.9 % IR SOLN
Status: DC | PRN
  Administered 2021-03-04: 6000 mL

## 2021-03-04 MED ORDER — ORAL CARE MOUTH RINSE: 15.0000 mL | Freq: Once | OROMUCOSAL | Status: AC

## 2021-03-04 MED ORDER — FENTANYL CITRATE (PF) 100 MCG/2ML IJ SOLN
INTRAMUSCULAR | Status: AC
  Filled 2021-03-04: qty 2

## 2021-03-04 MED ORDER — MIDAZOLAM HCL 2 MG/2ML IJ SOLN
INTRAMUSCULAR | Status: AC
  Filled 2021-03-04: qty 2

## 2021-03-04 MED ORDER — DEXAMETHASONE SODIUM PHOSPHATE 10 MG/ML IJ SOLN
INTRAMUSCULAR | Status: DC | PRN
  Administered 2021-03-04: 4 mg via INTRAVENOUS

## 2021-03-04 MED ORDER — OXYCODONE-ACETAMINOPHEN 5-325 MG PO TABS: 1.0000 | ORAL_TABLET | ORAL | 0 refills | Status: DC | PRN

## 2021-03-04 MED ORDER — DEXAMETHASONE SODIUM PHOSPHATE 10 MG/ML IJ SOLN
INTRAMUSCULAR | Status: AC
  Filled 2021-03-04: qty 1

## 2021-03-04 MED ORDER — ROCURONIUM BROMIDE 10 MG/ML (PF) SYRINGE
PREFILLED_SYRINGE | INTRAVENOUS | Status: AC
  Filled 2021-03-04: qty 10

## 2021-03-04 MED ORDER — ONDANSETRON HCL 4 MG/2ML IJ SOLN
INTRAMUSCULAR | Status: DC | PRN
  Administered 2021-03-04: 4 mg via INTRAVENOUS

## 2021-03-04 MED ORDER — SCOPOLAMINE 1 MG/3DAYS TD PT72
1.0000 | MEDICATED_PATCH | Freq: Once | TRANSDERMAL | Status: DC
  Administered 2021-03-04: 1.5 mg via TRANSDERMAL
  Filled 2021-03-04: qty 1

## 2021-03-04 MED ORDER — LACTATED RINGERS IV SOLN: INTRAVENOUS | Status: DC

## 2021-03-04 MED ORDER — CEPHALEXIN 500 MG PO CAPS: 500.0000 mg | ORAL_CAPSULE | Freq: Two times a day (BID) | ORAL | 0 refills | Status: AC

## 2021-03-04 MED ORDER — CHLORHEXIDINE GLUCONATE 0.12 % MT SOLN
15.0000 mL | Freq: Once | OROMUCOSAL | Status: AC
  Administered 2021-03-04: 15 mL via OROMUCOSAL

## 2021-03-04 MED ORDER — PROMETHAZINE HCL 25 MG/ML IJ SOLN: 6.2500 mg | INTRAMUSCULAR | Status: DC | PRN

## 2021-03-04 SURGICAL SUPPLY — 22 items
BAG URO CATCHER STRL LF (MISCELLANEOUS) ×2 IMPLANT
BASKET ZERO TIP NITINOL 2.4FR (BASKET) ×2 IMPLANT
CATH URET 5FR 28IN OPEN ENDED (CATHETERS) ×2 IMPLANT
CATH URET DUAL LUMEN 6-10FR 50 (CATHETERS) ×2 IMPLANT
CLOTH BEACON ORANGE TIMEOUT ST (SAFETY) ×2 IMPLANT
FIBER LASER MOSES 200 DFL (Laser) ×2 IMPLANT
GLOVE SURG ENC TEXT LTX SZ7 (GLOVE) ×2 IMPLANT
GOWN STRL REUS W/TWL LRG LVL3 (GOWN DISPOSABLE) ×4 IMPLANT
GUIDEWIRE STR DUAL SENSOR (WIRE) ×4 IMPLANT
GUIDEWIRE ZIPWRE .038 STRAIGHT (WIRE) IMPLANT
IV NS 1000ML (IV SOLUTION) ×2
IV NS 1000ML BAXH (IV SOLUTION) ×1 IMPLANT
KIT TURNOVER KIT A (KITS) ×2 IMPLANT
LASER FIB FLEXIVA PULSE ID 365 (Laser) IMPLANT
MANIFOLD NEPTUNE II (INSTRUMENTS) ×2 IMPLANT
PACK CYSTO (CUSTOM PROCEDURE TRAY) ×2 IMPLANT
SHEATH URETERAL 12FRX35CM (MISCELLANEOUS) ×2 IMPLANT
STENT URET 6FRX26 CONTOUR (STENTS) ×2 IMPLANT
TRACTIP FLEXIVA PULS ID 200XHI (Laser) IMPLANT
TRACTIP FLEXIVA PULSE ID 200 (Laser)
TUBING CONNECTING 10 (TUBING) ×2 IMPLANT
TUBING UROLOGY SET (TUBING) ×2 IMPLANT

## 2021-03-04 NOTE — OR Nursing (Signed)
Stone taken by Dr. Gay. ?

## 2021-03-04 NOTE — Anesthesia Postprocedure Evaluation (Signed)
Anesthesia Post Note  Patient: Tammy Taylor  Procedure(s) Performed: CYSTOSCOPY/RETROGRADE/URETEROSCOPY/HOLMIUM LASER/STENT PLACEMENT (Left)     Patient location during evaluation: PACU Anesthesia Type: General Level of consciousness: awake and alert and oriented Pain management: pain level controlled Vital Signs Assessment: post-procedure vital signs reviewed and stable Respiratory status: spontaneous breathing, nonlabored ventilation and respiratory function stable Cardiovascular status: blood pressure returned to baseline Postop Assessment: no apparent nausea or vomiting Anesthetic complications: no   No notable events documented.  Last Vitals:  Vitals:   03/04/21 0930 03/04/21 0945  BP: (!) 164/91 (!) 174/108  Pulse: 81 84  Resp: 18 14  Temp: 36.5 C   SpO2: 100% 94%    Last Pain:  Vitals:   03/04/21 0930  PainSc: 0-No pain                 Kaylyn Layer

## 2021-03-04 NOTE — Anesthesia Procedure Notes (Signed)
Procedure Name: Intubation Date/Time: 03/04/2021 7:34 AM Performed by: Lind Covert, CRNA Pre-anesthesia Checklist: Patient identified, Emergency Drugs available, Suction available and Patient being monitored Patient Re-evaluated:Patient Re-evaluated prior to induction Oxygen Delivery Method: Circle system utilized Preoxygenation: Pre-oxygenation with 100% oxygen Induction Type: IV induction Ventilation: Mask ventilation without difficulty Laryngoscope Size: Mac and 4 Grade View: Grade II Tube type: Oral Tube size: 7.0 mm Number of attempts: 1 Airway Equipment and Method: Stylet Placement Confirmation: ETT inserted through vocal cords under direct vision, positive ETCO2 and breath sounds checked- equal and bilateral Secured at: 22 cm Tube secured with: Tape Dental Injury: Teeth and Oropharynx as per pre-operative assessment

## 2021-03-04 NOTE — Transfer of Care (Signed)
Immediate Anesthesia Transfer of Care Note  Patient: Tammy Taylor  Procedure(s) Performed: CYSTOSCOPY/RETROGRADE/URETEROSCOPY/HOLMIUM LASER/STENT PLACEMENT (Left)  Patient Location: PACU  Anesthesia Type:General  Level of Consciousness: sedated  Airway & Oxygen Therapy: Patient Spontanous Breathing and Patient connected to face mask oxygen  Post-op Assessment: Report given to RN and Post -op Vital signs reviewed and stable  Post vital signs: Reviewed and stable  Last Vitals:  Vitals Value Taken Time  BP 172/102 03/04/21 0909  Temp    Pulse 81 03/04/21 0911  Resp 22 03/04/21 0911  SpO2 100 % 03/04/21 0911  Vitals shown include unvalidated device data.  Last Pain:  Vitals:   03/04/21 0611  PainSc: 0-No pain         Complications: No notable events documented.

## 2021-03-04 NOTE — Op Note (Signed)
Operative Note  Preoperative diagnosis:  1.  Left renal stones  Postoperative diagnosis: 1.  Left renal stones  Procedure(s): 1.  Cystoscopy 2. Left ureteroscopy with laser lithotripsy and basket extraction of stones 3. Left retrograde pyelogram 4. Left ureteral stent placement 5. Fluoroscopy with intraoperative interpretation  Surgeon: Jettie Pagan, MD  Assistants:  None  Anesthesia:  General  Complications:  None  EBL:  Minimal  Specimens: 1. Stones for stone analysis (to be done at [facility])  Drains/Catheters: 1.  Left 6Fr x 26cm ureteral stent WITHOUT a tether string  Intraoperative findings:   Cystoscopy demonstrated slightly narrowed caliber urethra that was dilated and accommodated 21 Fr cystoscope. No suspicious bladder lesions. Left ureteroscopy demonstrated approximately 1.5 cm left renal pelvis stone, a 5 mm left posterior lower pole stone, and a 1 cm left lower pole stone.  These were all dusted and basket extracted.  No significant large stone fragments remain at the end of the case. Left retropyelogram demonstrated no hydronephrosis and no evidence of extravasation of contrast at the end of the case. Successful left ureteral stent placement with curl and proximal left ureter and bladder respectively  Indication:  Tammy Taylor is a 40 y.o. female with with a history of bilateral nephrolithiasis. CT A/P 12/13/2020 revealed 1.4 cm stone in the left renal pelvis with slight urothelial thickening with mild pelvicalyceal dilatation. Also present were bilateral nonobstructive stones in the lower poles (my read: 1.4 cm left renal pelvis stone, 5 mm left posterior lower pole stone, 1 cm left lower pole stone; 12 mm right lower pole stone; skin to stone distance approximately 20 cm, Hounsfield units 560).  She was found to have a quite significant skin to skin distance and morbid obesity with BMI 54, elected to proceed with staged ureteroscopy.  She is being brought  to the operating today for left-sided ureteroscopy with laser lithotripsy.  Description of procedure: After informed consent was obtained from the patient, the patient was identified and taken to the operating room and placed in the supine position.  General anesthesia was administered as well as perioperative IV antibiotics.  At the beginning of the case, a time-out was performed to properly identify the patient, the surgery to be performed, and the surgical site.  Sequential compression devices were applied to the lower extremities at the beginning of the case for DVT prophylaxis.  The patient was then placed in the dorsal lithotomy supine position, prepped and draped in sterile fashion.  Preliminary scout fluoroscopy revealed that there was a 1.5 cm calcification area at the left renal pelvis as well as 2 separate about 1 cm stone burden in the left lower pole, which corresponds to the stones found on the preoperative CT scan. We then dilated her urethra as it was found to be slightly narrowed in caliber and passed the 21-French rigid cystoscope through the urethra and into the bladder under vision without any difficulty.  A systematic evaluation of the bladder revealed no evidence of any suspicious bladder lesions.  Ureteral orifices were in normal position.    Under cystoscopic and flouroscopic guidance, we cannulated the left ureteral orifice with a 5-French open-ended ureteral catheter and a gentle retrograde pyelogram was performed, revealing a normal caliber ureter without any filling defects. There was no hydronephrosis of the collecting system. There was 1.5 cm filling defect in the left renal pelvis corresponding to the stone. A 0.038 sensor wire was then passed up to the level of the renal pelvis and  secured to the drape as a safety wire. The ureteral catheter and cystoscope were removed, leaving the safety wire in place.   A semi-rigid ureteroscope was passed alongside the wire up the distal  ureter which appeared normal.  I then passed a dual-lumen catheter which passed quite easily into the distal left ureter.  I then passed a second 0.038 sensor wire to level the renal pelvis. A 12/14Fr 35 cm ureteral access sheath was carefully advanced up the ureter to the level of the UPJ over this wire under fluoroscopic guidance. The flexible ureteroscope was advanced into the collecting system via the access sheath. The collecting system was inspected. The calculus was identified at the left renal pelvis. Using the 200 micron holmium laser fiber, the stone was fragmented completely.  I use a setting of 0.3J and 60Hz . I encountered a separate 1 cm stone left lower pole as well as a 0.5 cm stone left lower pole.  These were also dusted.  Although there were no large stone fragments, a 2.2 Fr zero tip basket was used to remove the larger appearing fragments under visual guidance. These were sent for chemical analysis. With the ureteroscope in the kidney, a gentle pyelogram was performed to delineate the calyceal system and we evaluated the calyces systematically. We encountered a dust and fragments less than 10mm. The rest of the stone fragments were very tiny and these were  irrigated away gently. The calyces were re-inspected and there were no significant stone fragment residual.   We then withdrew the ureteroscope back down the ureter along with the access sheath, noting no evidence of any stones along the course of the ureter.  Once the ureteroscope was removed, the Glidewire was backloaded through the rigid cystoscope, which was then advanced down the urethra and into the bladder. We then used the Glidewire under direct vision through the rigid cystoscope and under fluoroscopic guidance and passed up a 6-French, 26 cm double-pigtail ureteral stent up ureter, making sure that the proximal and distal ends coiled within the left upper pole of the kidney and bladder respectively.  I did not leave a tether string.  The bladder was then emptied with irrigation solution.  The cystoscope was then removed.    The patient tolerated the procedure well and there was no complication. Patient was awoken from anesthesia and taken to the recovery room in stable condition. I was present and scrubbed for the entirety of the case.  Plan:  Patient will be discharged home.  Follow up with me in 7 to 10 days for stent removal in the office.   Matt R. Sheronica Corey MD [facility]  Pager: [phone]

## 2021-03-05 ENCOUNTER — Encounter (HOSPITAL_COMMUNITY): Payer: Self-pay | Admitting: Urology

## 2021-03-11 ENCOUNTER — Encounter: Payer: Medicaid Other | Attending: Family Medicine | Admitting: Dietician

## 2021-03-11 ENCOUNTER — Ambulatory Visit: Payer: Medicaid Other | Admitting: Dietician

## 2021-03-11 DIAGNOSIS — Z6841 Body Mass Index (BMI) 40.0 and over, adult: Secondary | ICD-10-CM | POA: Insufficient documentation

## 2021-03-11 DIAGNOSIS — I10 Essential (primary) hypertension: Secondary | ICD-10-CM | POA: Insufficient documentation

## 2021-03-11 DIAGNOSIS — E1165 Type 2 diabetes mellitus with hyperglycemia: Secondary | ICD-10-CM | POA: Insufficient documentation

## 2021-03-17 ENCOUNTER — Ambulatory Visit: Payer: Medicaid Other | Admitting: Podiatry

## 2021-03-24 ENCOUNTER — Ambulatory Visit: Payer: Medicaid Other | Admitting: Podiatry

## 2022-03-28 ENCOUNTER — Other Ambulatory Visit: Payer: Self-pay | Admitting: Nurse Practitioner

## 2022-03-28 DIAGNOSIS — Z9189 Other specified personal risk factors, not elsewhere classified: Secondary | ICD-10-CM

## 2022-04-14 ENCOUNTER — Other Ambulatory Visit: Payer: Medicaid Other

## 2022-04-25 ENCOUNTER — Other Ambulatory Visit: Payer: Medicaid Other

## 2023-03-13 LAB — HEMOGLOBIN A1C: Hemoglobin A1C: 12.9

## 2023-06-12 NOTE — Patient Instructions (Signed)

## 2023-06-13 ENCOUNTER — Ambulatory Visit: Payer: Medicaid Other | Admitting: Nurse Practitioner

## 2023-06-13 ENCOUNTER — Encounter: Payer: Self-pay | Admitting: Nurse Practitioner

## 2023-06-13 VITALS — BP 140/88 | HR 87 | Ht 67.0 in | Wt 323.0 lb

## 2023-06-13 DIAGNOSIS — E1165 Type 2 diabetes mellitus with hyperglycemia: Secondary | ICD-10-CM

## 2023-06-13 DIAGNOSIS — Z7984 Long term (current) use of oral hypoglycemic drugs: Secondary | ICD-10-CM | POA: Diagnosis not present

## 2023-06-13 MED ORDER — OZEMPIC (0.25 OR 0.5 MG/DOSE) 2 MG/1.5ML ~~LOC~~ SOPN: 0.5000 mg | PEN_INJECTOR | SUBCUTANEOUS | 1 refills | Status: DC

## 2023-06-13 NOTE — Progress Notes (Signed)
Endocrinology Consult Note       06/13/2023, 11:38 AM   Subjective:    Patient ID: Tammy Taylor, female    DOB: 1981-07-09.  Tammy Taylor is being seen in consultation for management of currently uncontrolled symptomatic diabetes requested by  Wilmon Pali, FNP.   Past Medical History:  Diagnosis Date   Diabetes mellitus without complication (HCC)    Dysrhythmia 2021   fluttering   GERD (gastroesophageal reflux disease)    History of kidney stones    Hypertension    Kidney stones    PCOS (polycystic ovarian syndrome)    Sleep apnea     Past Surgical History:  Procedure Laterality Date   CYSTOSCOPY/URETEROSCOPY/HOLMIUM LASER/STENT PLACEMENT Left 03/04/2021   Procedure: CYSTOSCOPY/RETROGRADE/URETEROSCOPY/HOLMIUM LASER/STENT PLACEMENT;  Surgeon: Jannifer Hick, MD;  Location: [facility];  Service: Urology;  Laterality: Left;    Social History   Socioeconomic History   Marital status: Single    Spouse name: Not on file   Number of children: Not on file   Years of education: Not on file   Highest education level: Not on file  Occupational History   Not on file  Tobacco Use   Smoking status: Never   Smokeless tobacco: Never  Vaping Use   Vaping status: Never Used  Substance and Sexual Activity   Alcohol use: No   Drug use: No   Sexual activity: Yes    Birth control/protection: None  Other Topics Concern   Not on file  Social History Narrative   Not on file   Social Determinants of Health   Financial Resource Strain: Low Risk  (05/08/2022)   Received from [facility]   Overall Financial Resource Strain (CARDIA)    Difficulty of Paying Living Expenses: Not very hard  Food Insecurity: No Food Insecurity (01/12/2022)   Received from [facility], [facility]   Hunger Vital Sign    Worried About Running Out of Food in the Last Year: Never true    Ran Out of Food in the Last  Year: Never true  Transportation Needs: No Transportation Needs (11/23/2021)   Received from [facility], [facility]   [facility] - Transportation    Lack of Transportation (Medical): No    Lack of Transportation (Non-Medical): No  Physical Activity: Not on file  Stress: Not on file  Social Connections: Not on file    Family History  Problem Relation Age of Onset   Diabetes Father    Cancer Paternal Aunt     Outpatient Encounter Medications as of 06/13/2023  Medication Sig   amLODipine (NORVASC) 10 MG tablet Take 10 mg by mouth daily.   atorvastatin (LIPITOR) 10 MG tablet Take 10 mg by mouth daily.   glipiZIDE (GLUCOTROL) 10 MG tablet Take 10 mg by mouth daily before breakfast.   hydrochlorothiazide (HYDRODIURIL) 12.5 MG tablet Take 12.5 mg by mouth every morning.   losartan (COZAAR) 100 MG tablet Take 100 mg by mouth daily.   Semaglutide,0.25 or 0.5MG /DOS, (OZEMPIC, 0.25 OR 0.5 MG/DOSE,) 2 MG/1.5ML SOPN Inject 0.5 mg into the skin once a week.   [DISCONTINUED] empagliflozin (JARDIANCE) 25 MG TABS  tablet Take 1 tablet by mouth daily.   [DISCONTINUED] clindamycin (CLEOCIN T) 1 % SWAB Apply 1 each topically 2 (two) times daily. (Patient not taking: Reported on 06/13/2023)   [DISCONTINUED] clobetasol (TEMOVATE) 0.05 % external solution Apply 1 application topically 2 (two) times daily as needed (Dermitis). (Patient not taking: Reported on 06/13/2023)   [DISCONTINUED] docusate sodium (COLACE) 100 MG capsule Take 1 capsule (100 mg total) by mouth daily as needed for up to 30 doses. (Patient not taking: Reported on 06/13/2023)   [DISCONTINUED] ergocalciferol (VITAMIN D2) 1.25 MG (50000 UT) capsule Take 50,000 Units by mouth once a week. (Patient not taking: Reported on 06/13/2023)   [DISCONTINUED] fluticasone (FLONASE) 50 MCG/ACT nasal spray Place 1 spray into both nostrils daily as needed for allergies.   [DISCONTINUED] furosemide (LASIX) 20 MG tablet Take 20 mg by mouth daily.  (Patient not taking: Reported on 06/13/2023)   [DISCONTINUED] ketoconazole (NIZORAL) 2 % shampoo Apply 1 application topically 2 (two) times a week. (Patient not taking: Reported on 06/13/2023)   [DISCONTINUED] metFORMIN (GLUCOPHAGE-XR) 500 MG 24 hr tablet Take 500 mg by mouth daily with breakfast. (Patient not taking: Reported on 06/13/2023)   [DISCONTINUED] metoprolol succinate (TOPROL-XL) 50 MG 24 hr tablet Take 50 mg by mouth daily. (Patient not taking: Reported on 06/13/2023)   [DISCONTINUED] oxyCODONE-acetaminophen (PERCOCET) 5-325 MG tablet Take 1 tablet by mouth every 4 (four) hours as needed for up to 18 doses for severe pain. (Patient not taking: Reported on 06/13/2023)   [DISCONTINUED] prenatal vitamin w/FE, FA (PRENATAL 1 + 1) 27-1 MG TABS tablet Take 1 tablet by mouth daily. (Patient not taking: Reported on 06/13/2023)   No facility-administered encounter medications on file as of 06/13/2023.    ALLERGIES: Allergies  Allergen Reactions   Lisinopril Cough and Other (See Comments)    cough     VACCINATION STATUS:  There is no immunization history on file for this patient.  Diabetes She presents for her initial diabetic visit. She has type 2 diabetes mellitus. Onset time: Diagnosed first with GD at age 42. Her disease course has been fluctuating. There are no hypoglycemic associated symptoms. Associated symptoms include fatigue, polydipsia, polyuria and weight loss. There are no hypoglycemic complications. Symptoms are stable. There are no diabetic complications. Risk factors for coronary artery disease include diabetes mellitus, dyslipidemia, family history, obesity, hypertension and sedentary lifestyle. Current diabetic treatment includes oral agent (dual therapy). She is compliant with treatment most of the time. Her weight is decreasing steadily. She is following a generally unhealthy diet. When asked about meal planning, she reported none. She has not had a previous visit with a  dietitian. She participates in exercise intermittently. (She presents today for her consultation with no meter or logs to review.  Her POCT A1c today is 12.5%, improving slightly from last A1c of 12.9% in July.  She monitors fasting glucose daily, reports it was 287 this morning.  She drinks mostly water and lemonade, has cut out sodas.  She does not eat on any routine pattern.  She does not engage in routine physical activity.  She is UTD on eye exam, has never seen podiatry in the past.) An ACE inhibitor/angiotensin II receptor blocker is being taken. She does not see a podiatrist.Eye exam is current.     Review of systems  Constitutional: +decreasing body weight, current Body mass index is 50.59 kg/m., no fatigue, no subjective hyperthermia, no subjective hypothermia Eyes: no blurry vision, no xerophthalmia ENT: no sore throat, no nodules  palpated in throat, no dysphagia/odynophagia, no hoarseness Cardiovascular: no chest pain, no shortness of breath, no palpitations, no leg swelling Respiratory: no cough, no shortness of breath Gastrointestinal: no nausea/vomiting/diarrhea Musculoskeletal: no muscle/joint aches Skin: no rashes, no hyperemia Neurological: no tremors, no numbness, no tingling, no dizziness Psychiatric: no depression, no anxiety  Objective:     BP (!) 140/88 (BP Location: Right Arm, Patient Position: Sitting, Cuff Size: Large)   Pulse 87   Ht 5\' 7"  (1.702 m)   Wt (!) 323 lb (146.5 kg)   BMI 50.59 kg/m   Wt Readings from Last 3 Encounters:  06/13/23 (!) 323 lb (146.5 kg)  03/04/21 (!) 352 lb (159.7 kg)  02/21/21 (!) 352 lb (159.7 kg)     BP Readings from Last 3 Encounters:  06/13/23 (!) 140/88  03/04/21 (!) 162/97  02/21/21 (!) 185/92     Physical Exam- Limited  Constitutional:  Body mass index is 50.59 kg/m. , not in acute distress, normal state of mind Eyes:  EOMI, no exophthalmos Neck: Supple Cardiovascular: RRR, + murmur, rubs, or gallops, no  edema Respiratory: Adequate breathing efforts, no crackles, rales, rhonchi, or wheezing Musculoskeletal: no gross deformities, strength intact in all four extremities, no gross restriction of joint movements Skin:  no rashes, no hyperemia Neurological: no tremor with outstretched hands   Diabetic Foot Exam - Simple   No data filed      CMP ( most recent) CMP     Component Value Date/Time   NA 138 03/04/2021 0632   K 3.4 (L) 03/04/2021 0632   CL 102 03/04/2021 0632   CO2 26 03/04/2021 0632   GLUCOSE 147 (H) 03/04/2021 0632   BUN 11 03/04/2021 0632   CREATININE 0.86 03/04/2021 0632   CALCIUM 9.2 03/04/2021 0632   GFRNONAA >60 03/04/2021 4098     Diabetic Labs (most recent): Lab Results  Component Value Date   HGBA1C 12.9 03/13/2023   HGBA1C 7.5 (H) 02/21/2021     Lipid Panel ( most recent) Lipid Panel  No results found for: "CHOL", "TRIG", "HDL", "CHOLHDL", "VLDL", "LDLCALC", "LDLDIRECT", "LABVLDL"    No results found for: "TSH", "FREET4"         Assessment & Plan:   1) Type 2 diabetes mellitus with hyperglycemia, without long-term current use of insulin (HCC)  She presents today for her consultation with no meter or logs to review.  Her POCT A1c today is 12.5%, improving slightly from last A1c of 12.9% in July.  She monitors fasting glucose daily, reports it was 287 this morning.  She drinks mostly water and lemonade, has cut out sodas.  She does not eat on any routine pattern.  She does not engage in routine physical activity.  She is UTD on eye exam, has never seen podiatry in the past.  - Tammy Taylor has currently uncontrolled symptomatic type 2 DM since 42 years of age, with most recent A1c of 12.9 %.   -Recent labs reviewed.  - I had a long discussion with her about the progressive nature of diabetes and the pathology behind its complications. -her diabetes is not currently complicated but she remains at a high risk for more acute and chronic  complications which include CAD, CVA, CKD, retinopathy, and neuropathy. These are all discussed in detail with her.  The following Lifestyle Medicine recommendations according to American College of Lifestyle Medicine [place] Day) were discussed and offered to patient and she agrees to start the journey:  A. Whole Foods, Plant-based  plate comprising of fruits and vegetables, plant-based proteins, whole-grain carbohydrates was discussed in detail with the patient.   A list for source of those nutrients were also provided to the patient.  Patient will use only water or unsweetened tea for hydration. B.  The need to stay away from risky substances including alcohol, smoking; obtaining 7 to 9 hours of restorative sleep, at least 150 minutes of moderate intensity exercise weekly, the importance of healthy social connections,  and stress reduction techniques were discussed. C.  A full color page of  Calorie density of various food groups per pound showing examples of each food groups was provided to the patient.  - I have counseled her on diet and weight management by adopting a carbohydrate restricted/protein rich diet. Patient is encouraged to switch to unprocessed or minimally processed complex starch and increased protein intake (animal or plant source), fruits, and vegetables. -  she is advised to stick to a routine mealtimes to eat 3 meals a day and avoid unnecessary snacks (to snack only to correct hypoglycemia).   - she acknowledges that there is a room for improvement in her food and drink choices. - Suggestion is made for her to avoid simple carbohydrates from her diet including Cakes, Sweet Desserts, Ice Cream, Soda (diet and regular), Sweet Tea, Candies, Chips, Cookies, Store Bought Juices, Alcohol in Excess of 1-2 drinks a day, Artificial Sweeteners, Coffee Creamer, and "Sugar-free" Products. This will help patient to have more stable blood glucose profile and potentially avoid unintended weight  gain.  - I have approached her with the following individualized plan to manage her diabetes and patient agrees:   -We discussed the potential need to add insulin to her regimen to help get her glucose readings on track quicker.  Will discuss further at next visit.  -she is encouraged to start monitoring glucose 2 times daily, before breakfast and before bed, to log their readings on the clinic sheets provided, and bring them to review at follow up appointment in 1 month.  - Adjustment parameters are given to her for hypo and hyperglycemia in writing. - she is encouraged to call clinic for blood glucose levels less than 70 or above 300 mg /dl. - she is advised to continue Glipizide 10 mg XL daily with breakfast, therapeutically suitable for patient . - her [place] Pepper will be discontinued, risk outweighs benefit for this patient- she reports significant problem with yeast infections with this medication.  - she did not tolerate Ozempic in the past when increased to 1 mg dose, caused significant constipation but she is willing to try it again.  I discussed and reinitiated her on Ozempic 0.25 mg SQ weekly x 2 weeks, then increase to 0.5 mg SQ weekly thereafter.  We talked about strategies to avoid side effects of this medication including eating plenty of fiber and drinking plenty of water.    She does not have history of pancreatitis, does not smoke, no personal or family history of MTC, therefore she is an excellent candidate.  - Specific targets for  A1c; LDL, HDL, and Triglycerides were discussed with the patient.  2) Blood Pressure /Hypertension:  her blood pressure is controlled to target.   she is advised to continue her current medications including Losartan 100 mg p.o. daily with breakfast, Amlodipine 10 mg po daily, HCT 12.5 mg po daily.  3) Lipids/Hyperlipidemia:    There is no recent lipid panel available to review .  she is advised to continue Lipitor 10 mg  daily at bedtime.  Side  effects and precautions discussed with her.  4)  Weight/Diet:  her Body mass index is 50.59 kg/m.  -  clearly complicating her diabetes care.   she is a candidate for weight loss. I discussed with her the fact that loss of 5 - 10% of her  current body weight will have the most impact on her diabetes management.  Exercise, and detailed carbohydrates information provided  -  detailed on discharge instructions.  5) Chronic Care/Health Maintenance: -she is on ACEI/ARB and Statin medications and is encouraged to initiate and continue to follow up with Ophthalmology, Dentist, Podiatrist at least yearly or according to recommendations, and advised to stay away from smoking. I have recommended yearly flu vaccine and pneumonia vaccine at least every 5 years; moderate intensity exercise for up to 150 minutes weekly; and sleep for at least 7 hours a day.  - she is advised to maintain close follow up with Wilmon Pali, FNP for primary care needs, as well as her other providers for optimal and coordinated care.   - Time spent in this patient care: 60 min, of which > 50% was spent in counseling her about her diabetes and the rest reviewing her blood glucose logs, discussing her hypoglycemia and hyperglycemia episodes, reviewing her current and previous labs/studies (including abstraction from other facilities) and medications doses and developing a long term treatment plan based on the latest standards of care/guidelines; and documenting her care.    Please refer to Patient Instructions for Blood Glucose Monitoring and Insulin/Medications Dosing Guide" in media tab for additional information. Please also refer to "Patient Self Inventory" in the Media tab for reviewed elements of pertinent patient history.  Tammy Taylor participated in the discussions, expressed understanding, and voiced agreement with the above plans.  All questions were answered to her satisfaction. she is encouraged to contact clinic  should she have any questions or concerns prior to her return visit.     Follow up plan: - Return in about 4 weeks (around 07/11/2023) for Diabetes F/U, Bring meter and logs.    Ronny Bacon, [facility] [address] [place], [place] [address] Phone: [phone] Fax: [phone]  06/13/2023, 11:38 AM

## 2023-07-12 ENCOUNTER — Ambulatory Visit: Payer: Medicaid Other | Admitting: Nurse Practitioner

## 2023-07-12 ENCOUNTER — Encounter: Payer: Self-pay | Admitting: Nurse Practitioner

## 2023-07-12 VITALS — BP 150/90 | HR 89 | Ht 67.0 in | Wt 330.0 lb

## 2023-07-12 DIAGNOSIS — Z7984 Long term (current) use of oral hypoglycemic drugs: Secondary | ICD-10-CM

## 2023-07-12 DIAGNOSIS — E1165 Type 2 diabetes mellitus with hyperglycemia: Secondary | ICD-10-CM

## 2023-07-12 DIAGNOSIS — Z7985 Long-term (current) use of injectable non-insulin antidiabetic drugs: Secondary | ICD-10-CM

## 2023-07-12 LAB — POCT UA - MICROALBUMIN
Creatinine, POC: 300 mg/dL
Microalbumin Ur, POC: 150 mg/L

## 2023-07-12 MED ORDER — GLIPIZIDE ER 5 MG PO TB24: 10.0000 mg | ORAL_TABLET | Freq: Every day | ORAL | 3 refills | Status: DC

## 2023-07-12 NOTE — Progress Notes (Signed)
Endocrinology Follow Up Note       07/12/2023, 8:45 AM   Subjective:    Patient ID: Tammy Taylor, female    DOB: 1980/09/11.  Tammy Taylor is being seen in follow up after being seen in consultation for management of currently uncontrolled symptomatic diabetes requested by  Wilmon Pali, FNP.   Past Medical History:  Diagnosis Date   Diabetes mellitus without complication (HCC)    Dysrhythmia 2021   fluttering   GERD (gastroesophageal reflux disease)    History of kidney stones    Hypertension    Kidney stones    PCOS (polycystic ovarian syndrome)    Sleep apnea     Past Surgical History:  Procedure Laterality Date   CYSTOSCOPY/URETEROSCOPY/HOLMIUM LASER/STENT PLACEMENT Left 03/04/2021   Procedure: CYSTOSCOPY/RETROGRADE/URETEROSCOPY/HOLMIUM LASER/STENT PLACEMENT;  Surgeon: Jannifer Hick, MD;  Location: [facility];  Service: Urology;  Laterality: Left;    Social History   Socioeconomic History   Marital status: Single    Spouse name: Not on file   Number of children: Not on file   Years of education: Not on file   Highest education level: Not on file  Occupational History   Not on file  Tobacco Use   Smoking status: Never   Smokeless tobacco: Never  Vaping Use   Vaping status: Never Used  Substance and Sexual Activity   Alcohol use: No   Drug use: No   Sexual activity: Yes    Birth control/protection: None  Other Topics Concern   Not on file  Social History Narrative   Not on file   Social Determinants of Health   Financial Resource Strain: Low Risk  (05/08/2022)   Received from [facility]   Overall Financial Resource Strain (CARDIA)    Difficulty of Paying Living Expenses: Not very hard  Food Insecurity: No Food Insecurity (01/12/2022)   Received from [facility], [facility]   Hunger Vital Sign    Worried About Running Out of Food in the Last Year: Never true     Ran Out of Food in the Last Year: Never true  Transportation Needs: No Transportation Needs (11/23/2021)   Received from [facility], [facility]   [facility] - Transportation    Lack of Transportation (Medical): No    Lack of Transportation (Non-Medical): No  Physical Activity: Not on file  Stress: Not on file  Social Connections: Not on file    Family History  Problem Relation Age of Onset   Diabetes Father    Cancer Paternal Aunt     Outpatient Encounter Medications as of 07/12/2023  Medication Sig   amLODipine (NORVASC) 10 MG tablet Take 10 mg by mouth daily.   atorvastatin (LIPITOR) 10 MG tablet Take 10 mg by mouth daily.   glipiZIDE (GLUCOTROL XL) 5 MG 24 hr tablet Take 2 tablets (10 mg total) by mouth daily with breakfast.   hydrochlorothiazide (HYDRODIURIL) 12.5 MG tablet Take 12.5 mg by mouth every morning.   losartan (COZAAR) 100 MG tablet Take 100 mg by mouth daily.   Semaglutide,0.25 or 0.5MG /DOS, (OZEMPIC, 0.25 OR 0.5 MG/DOSE,) 2 MG/1.5ML SOPN Inject 0.5 mg into  the skin once a week.   [DISCONTINUED] glipiZIDE (GLUCOTROL) 10 MG tablet Take 10 mg by mouth daily before breakfast.   No facility-administered encounter medications on file as of 07/12/2023.    ALLERGIES: Allergies  Allergen Reactions   Lisinopril Cough and Other (See Comments)    cough     VACCINATION STATUS:  There is no immunization history on file for this patient.  Diabetes She presents for her follow-up diabetic visit. She has type 2 diabetes mellitus. Onset time: Diagnosed first with GD at age 18. Her disease course has been improving. There are no hypoglycemic associated symptoms. Associated symptoms include fatigue, polydipsia and polyuria. Pertinent negatives for diabetes include no weight loss. There are no hypoglycemic complications. Symptoms are stable. There are no diabetic complications. Risk factors for coronary artery disease include diabetes mellitus, dyslipidemia, family  history, obesity, hypertension and sedentary lifestyle. Current diabetic treatment includes oral agent (monotherapy) (and Ozempic). She is compliant with treatment most of the time. Her weight is fluctuating minimally. She is following a generally unhealthy diet. When asked about meal planning, she reported none. She has not had a previous visit with a dietitian. She participates in exercise intermittently. Her home blood glucose trend is decreasing steadily. Her breakfast blood glucose range is generally 140-180 mg/dl. Her bedtime blood glucose range is generally 140-180 mg/dl. Her overall blood glucose range is 140-180 mg/dl. (She presents today with her logs showing greatly improved glycemic profile.  She was not due for another A1c today.  She notes she has tolerated the Ozempic well this time, no unpleasant GI side effects.  She is working really hard on her diet.  She does note some increased stress.) An ACE inhibitor/angiotensin II receptor blocker is being taken. She does not see a podiatrist.Eye exam is current.     Review of systems  Constitutional: + minimally fluctuating body weight, current Body mass index is 51.69 kg/m., no fatigue, no subjective hyperthermia, no subjective hypothermia Eyes: no blurry vision, no xerophthalmia ENT: no sore throat, no nodules palpated in throat, no dysphagia/odynophagia, no hoarseness Cardiovascular: no chest pain, no shortness of breath, no palpitations, no leg swelling Respiratory: no cough, no shortness of breath Gastrointestinal: no nausea/vomiting/diarrhea Musculoskeletal: no muscle/joint aches Skin: no rashes, no hyperemia Neurological: no tremors, no numbness, no tingling, no dizziness Psychiatric: no depression, no anxiety, + increased stress  Objective:     BP (!) 150/90 (BP Location: Right Arm, Patient Position: Sitting, Cuff Size: Large) Comment: Retake manuel cuff, patient states that she has taken her BP med this morning, She follows  [facility] for her HTN Care. Frederick Marro made aware.  Pulse 89   Ht 5\' 7"  (1.702 m)   Wt (!) 330 lb (149.7 kg)   BMI 51.69 kg/m   Wt Readings from Last 3 Encounters:  07/12/23 (!) 330 lb (149.7 kg)  06/13/23 (!) 323 lb (146.5 kg)  03/04/21 (!) 352 lb (159.7 kg)     BP Readings from Last 3 Encounters:  07/12/23 (!) 150/90  06/13/23 (!) 140/88  03/04/21 (!) 162/97     Physical Exam- Limited  Constitutional:  Body mass index is 51.69 kg/m. , not in acute distress, normal state of mind Eyes:  EOMI, no exophthalmos Musculoskeletal: no gross deformities, strength intact in all four extremities, no gross restriction of joint movements Skin:  no rashes, no hyperemia Neurological: no tremor with outstretched hands   Diabetic Foot Exam - Simple   No data filed  CMP ( most recent) CMP     Component Value Date/Time   NA 138 03/04/2021 0632   K 3.4 (L) 03/04/2021 0632   CL 102 03/04/2021 0632   CO2 26 03/04/2021 0632   GLUCOSE 147 (H) 03/04/2021 0632   BUN 11 03/04/2021 0632   CREATININE 0.86 03/04/2021 0632   CALCIUM 9.2 03/04/2021 0632   GFRNONAA >60 03/04/2021 0272     Diabetic Labs (most recent): Lab Results  Component Value Date   HGBA1C 12.9 03/13/2023   HGBA1C 7.5 (H) 02/21/2021   MICROALBUR 150 mg/L 07/12/2023     Lipid Panel ( most recent) Lipid Panel  No results found for: "CHOL", "TRIG", "HDL", "CHOLHDL", "VLDL", "LDLCALC", "LDLDIRECT", "LABVLDL"    No results found for: "TSH", "FREET4"         Assessment & Plan:   1) Type 2 diabetes mellitus with hyperglycemia, without long-term current use of insulin (HCC)  She presents today with her logs showing greatly improved glycemic profile.  She was not due for another A1c today.  She notes she has tolerated the Ozempic well this time, no unpleasant GI side effects.  She is working really hard on her diet.  She does note some increased stress.  Tammy Taylor has currently uncontrolled  symptomatic type 2 DM since 42 years of age, with most recent A1c of 12.9 %.   -Recent labs reviewed.  Her POCT UM shows mild microalbuminuria.  Will recheck CMP prior to next visit.  - I had a long discussion with her about the progressive nature of diabetes and the pathology behind its complications. -her diabetes is not currently complicated but she remains at a high risk for more acute and chronic complications which include CAD, CVA, CKD, retinopathy, and neuropathy. These are all discussed in detail with her.  The following Lifestyle Medicine recommendations according to American College of Lifestyle Medicine [facility]) were discussed and offered to patient and she agrees to start the journey:  A. Whole Foods, Plant-based plate comprising of fruits and vegetables, plant-based proteins, whole-grain carbohydrates was discussed in detail with the patient.   A list for source of those nutrients were also provided to the patient.  Patient will use only water or unsweetened tea for hydration. B.  The need to stay away from risky substances including alcohol, smoking; obtaining 7 to 9 hours of restorative sleep, at least 150 minutes of moderate intensity exercise weekly, the importance of healthy social connections,  and stress reduction techniques were discussed. C.  A full color page of  Calorie density of various food groups per pound showing examples of each food groups was provided to the patient.  - Nutritional counseling repeated at each appointment due to patients tendency to fall back in to old habits.  - The patient admits there is a room for improvement in their diet and drink choices. -  Suggestion is made for the patient to avoid simple carbohydrates from their diet including Cakes, Sweet Desserts / Pastries, Ice Cream, Soda (diet and regular), Sweet Tea, Candies, Chips, Cookies, Sweet Pastries, Store Bought Juices, Alcohol in Excess of 1-2 drinks a day, Artificial Sweeteners, Coffee Creamer,  and "Sugar-free" Products. This will help patient to have stable blood glucose profile and potentially avoid unintended weight gain.   - I encouraged the patient to switch to unprocessed or minimally processed complex starch and increased protein intake (animal or plant source), fruits, and vegetables.   - Patient is advised to stick to a  routine mealtimes to eat 3 meals a day and avoid unnecessary snacks (to snack only to correct hypoglycemia).  - I have approached her with the following individualized plan to manage her diabetes and patient agrees:   -We discussed the potential need to add insulin to her regimen to help get her glucose readings on track quicker.  However, given her great improvement over the last few weeks, we will not need to do this just yet.  She can continue her Glipizide 10 mg XL daily with breakfast and Ozempic 0.5 mg SQ weekly.  Taylor look to decrease Glipizide on subsequent visits.  -she is encouraged to continue monitoring glucose 2 times daily, before breakfast and before bed, and to call the clinic if she has readings less than 70 or above 300 for 3 tests in a row.  - Adjustment parameters are given to her for hypo and hyperglycemia in writing. - she is encouraged to call clinic for blood glucose levels less than 70 or above 300 mg /dl.  - her Jardiance as previously discontinued, risk outweighs benefit for this patient- she reports significant problem with yeast infections with this medication.  She does not have history of pancreatitis, does not smoke, no personal or family history of MTC, therefore she is an excellent candidate.  - Specific targets for  A1c; LDL, HDL, and Triglycerides were discussed with the patient.  2) Blood Pressure /Hypertension:  her blood pressure is NOT controlled to target but she does attribute this to recent stress.   she is advised to continue her current medications including Losartan 100 mg p.o. daily with breakfast, Amlodipine 10  mg po daily, HCT 12.5 mg po daily.  I encouraged her to follow up with her PCP regarding high readings.  3) Lipids/Hyperlipidemia:    There is no recent lipid panel available to review .  she is advised to continue Lipitor 10 mg daily at bedtime.  Side effects and precautions discussed with her.  Will recheck lipid panel prior to next visit.  4)  Weight/Diet:  her Body mass index is 51.69 kg/m.  -  clearly complicating her diabetes care.   she is a candidate for weight loss. I discussed with her the fact that loss of 5 - 10% of her  current body weight will have the most impact on her diabetes management.  Exercise, and detailed carbohydrates information provided  -  detailed on discharge instructions.  5) Chronic Care/Health Maintenance: -she is on ACEI/ARB and Statin medications and is encouraged to initiate and continue to follow up with Ophthalmology, Dentist, Podiatrist at least yearly or according to recommendations, and advised to stay away from smoking. I have recommended yearly flu vaccine and pneumonia vaccine at least every 5 years; moderate intensity exercise for up to 150 minutes weekly; and sleep for at least 7 hours a day.  - she is advised to maintain close follow up with Wilmon Pali, FNP for primary care needs, as well as her other providers for optimal and coordinated care.     I spent  39  minutes in the care of the patient today including review of labs from CMP, Lipids, Thyroid Function, Hematology (current and previous including abstractions from other facilities); face-to-face time discussing  her blood glucose readings/logs, discussing hypoglycemia and hyperglycemia episodes and symptoms, medications doses, her options of short and long term treatment based on the latest standards of care / guidelines;  discussion about incorporating lifestyle medicine;  and documenting the encounter. Risk  reduction counseling performed per USPSTF guidelines to reduce obesity and  cardiovascular risk factors.     Please refer to Patient Instructions for Blood Glucose Monitoring and Insulin/Medications Dosing Guide"  in media tab for additional information. Please  also refer to " Patient Self Inventory" in the Media  tab for reviewed elements of pertinent patient history.  Tammy Taylor participated in the discussions, expressed understanding, and voiced agreement with the above plans.  All questions were answered to her satisfaction. she is encouraged to contact clinic should she have any questions or concerns prior to her return visit.     Follow up plan: - Return in about 3 months (around 10/12/2023) for Diabetes F/U with A1c in office, Previsit labs, Bring meter and logs.   Ronny Bacon, [facility] [address] [place], [place] [address] Phone: [phone] Fax: [phone]  07/12/2023, 8:45 AM

## 2023-08-01 LAB — COMPREHENSIVE METABOLIC PANEL
ALT: 14 [IU]/L (ref 0–32)
AST: 14 [IU]/L (ref 0–40)
Albumin: 4.1 g/dL (ref 3.9–4.9)
Alkaline Phosphatase: 80 [IU]/L (ref 44–121)
BUN/Creatinine Ratio: 13 (ref 9–23)
BUN: 13 mg/dL (ref 6–24)
Bilirubin Total: 0.4 mg/dL (ref 0.0–1.2)
CO2: 23 mmol/L (ref 20–29)
Calcium: 9.4 mg/dL (ref 8.7–10.2)
Chloride: 102 mmol/L (ref 96–106)
Creatinine, Ser: 1.02 mg/dL — ABNORMAL HIGH (ref 0.57–1.00)
Globulin, Total: 2.9 g/dL (ref 1.5–4.5)
Glucose: 139 mg/dL — ABNORMAL HIGH (ref 70–99)
Potassium: 4.2 mmol/L (ref 3.5–5.2)
Sodium: 142 mmol/L (ref 134–144)
Total Protein: 7 g/dL (ref 6.0–8.5)
eGFR: 70 mL/min/{1.73_m2} (ref >59)

## 2023-08-01 LAB — LIPID PANEL
Chol/HDL Ratio: 2.7 {ratio} (ref 0.0–4.4)
Cholesterol, Total: 119 mg/dL (ref 100–199)
HDL: 44 mg/dL (ref >39)
LDL Chol Calc (NIH): 62 mg/dL (ref 0–99)
Triglycerides: 59 mg/dL (ref 0–149)
VLDL Cholesterol Cal: 13 mg/dL (ref 5–40)

## 2023-08-01 LAB — TSH: TSH: 4.22 u[IU]/mL (ref 0.450–4.500)

## 2023-08-01 LAB — VITAMIN D 25 HYDROXY (VIT D DEFICIENCY, FRACTURES): Vit D, 25-Hydroxy: 30.7 ng/mL (ref 30.0–100.0)

## 2023-08-01 LAB — T4, FREE: Free T4: 1.19 ng/dL (ref 0.82–1.77)

## 2023-09-14 ENCOUNTER — Encounter: Payer: Self-pay | Admitting: Internal Medicine

## 2023-09-14 ENCOUNTER — Other Ambulatory Visit: Payer: Medicaid Other

## 2023-09-14 ENCOUNTER — Other Ambulatory Visit: Payer: Self-pay | Admitting: Internal Medicine

## 2023-09-14 ENCOUNTER — Ambulatory Visit: Payer: Medicaid Other | Attending: Internal Medicine | Admitting: Internal Medicine

## 2023-09-14 VITALS — BP 160/102 | HR 98 | Ht 67.0 in | Wt 340.8 lb

## 2023-09-14 DIAGNOSIS — R002 Palpitations: Secondary | ICD-10-CM | POA: Diagnosis not present

## 2023-09-14 DIAGNOSIS — R009 Unspecified abnormalities of heart beat: Secondary | ICD-10-CM | POA: Diagnosis not present

## 2023-09-14 DIAGNOSIS — O10011 Pre-existing essential hypertension complicating pregnancy, first trimester: Secondary | ICD-10-CM

## 2023-09-14 DIAGNOSIS — I10 Essential (primary) hypertension: Secondary | ICD-10-CM | POA: Diagnosis not present

## 2023-09-14 DIAGNOSIS — R0789 Other chest pain: Secondary | ICD-10-CM | POA: Diagnosis not present

## 2023-09-14 DIAGNOSIS — Z8759 Personal history of other complications of pregnancy, childbirth and the puerperium: Secondary | ICD-10-CM

## 2023-09-14 MED ORDER — CARVEDILOL 6.25 MG PO TABS: 6.2500 mg | ORAL_TABLET | Freq: Two times a day (BID) | ORAL | 3 refills | Status: DC

## 2023-09-14 NOTE — Patient Instructions (Signed)
Medication Instructions:  Your physician has recommended you make the following change in your medication:  Start Coreg  6.25 mg twice daily Continue taking all other medications as prescribed  Labwork: None  Testing/Procedures: Your physician has recommended that you wear a Zio monitor.   This monitor is a medical device that records the heart's electrical activity. Doctors most often use these monitors to diagnose arrhythmias. Arrhythmias are problems with the speed or rhythm of the heartbeat. The monitor is a small device applied to your chest. You can wear one while you do your normal daily activities. While wearing this monitor if you have any symptoms to push the button and record what you felt. Once you have worn this monitor for the period of time provider prescribed (for 14 days), you will return the monitor device in the postage paid box. Once it is returned they will download the data collected and provide [place] with a report which the provider will then review and we will call you with those results. Important tips:  Avoid showering during the first 24 hours of wearing the monitor. Avoid excessive sweating to help maximize wear time. Do not submerge the device, no hot tubs, and no swimming pools. Keep any lotions or oils away from the patch. After 24 hours you may shower with the patch on. Take brief showers with your back facing the shower head.  Do not remove patch once it has been placed because that will interrupt data and decrease adhesive wear time. Push the button when you have any symptoms and write down what you were feeling. Once you have completed wearing your monitor, remove and place into box which has postage paid and place in your outgoing mailbox.  If for some reason you have misplaced your box then call our office and we can provide another box and/or mail it off for you.   Follow-Up: Your physician recommends that you schedule a follow-up appointment in: 3  months  Any Other Special Instructions Will Be Listed Below (If Applicable).  If you need a refill on your cardiac medications before your next appointment, please call your pharmacy.

## 2023-09-14 NOTE — Progress Notes (Signed)
Cardiology Office Note  Date: 09/14/2023   ID: Tammy Taylor, DOB 1981-03-12, MRN [id number]  PCP:  Wilmon Pali, FNP  Cardiologist:  Marjo Bicker, MD Electrophysiologist:  None   History of Present Illness: Tammy Taylor is a 43 y.o. female known to have HTN was referred to cardiology today for management of HTN.  Home BPs range around 140 to 150 mmHg SBP.  Does have some chest tightness intermittently.  No other symptoms of DOE, dizziness, syncope, leg swelling.  Has some occasional palpitations.  Past Medical History:  Diagnosis Date   Diabetes mellitus without complication (HCC)    Dysrhythmia 2021   fluttering   GERD (gastroesophageal reflux disease)    History of kidney stones    Hypertension    Kidney stones    PCOS (polycystic ovarian syndrome)    Sleep apnea     Past Surgical History:  Procedure Laterality Date   CYSTOSCOPY/URETEROSCOPY/HOLMIUM LASER/STENT PLACEMENT Left 03/04/2021   Procedure: CYSTOSCOPY/RETROGRADE/URETEROSCOPY/HOLMIUM LASER/STENT PLACEMENT;  Surgeon: Jannifer Hick, MD;  Location: [facility];  Service: Urology;  Laterality: Left;    Current Outpatient Medications  Medication Sig Dispense Refill   amLODipine (NORVASC) 10 MG tablet Take 10 mg by mouth daily.     atorvastatin (LIPITOR) 10 MG tablet Take 10 mg by mouth daily.     carvedilol (COREG) 6.25 MG tablet Take 1 tablet (6.25 mg total) by mouth 2 (two) times daily with a meal. 60 tablet 3   furosemide (LASIX) 40 MG tablet Take 40 mg by mouth daily.     glipiZIDE (GLUCOTROL XL) 5 MG 24 hr tablet Take 2 tablets (10 mg total) by mouth daily with breakfast. 180 tablet 3   losartan (COZAAR) 100 MG tablet Take 100 mg by mouth daily.     Semaglutide,0.25 or 0.5MG /DOS, (OZEMPIC, 0.25 OR 0.5 MG/DOSE,) 2 MG/1.5ML SOPN Inject 0.5 mg into the skin once a week. 6 mL 1   hydrochlorothiazide (HYDRODIURIL) 12.5 MG tablet Take 12.5 mg by mouth every morning. (Patient not taking: Reported on  09/14/2023)     No current facility-administered medications for this visit.   Allergies:  Lisinopril   Social History: The patient  reports that she has never smoked. She has never used smokeless tobacco. She reports that she does not drink alcohol and does not use drugs.   Family History: The patient's family history includes Cancer in her paternal aunt; Diabetes in her father.   ROS:  Please see the history of present illness. Otherwise, complete review of systems is positive for none  All other systems are reviewed and negative.   Physical Exam: VS:  BP (!) 160/102   Pulse 98   Ht 5\' 7"  (1.702 m)   Wt (!) 340 lb 12.8 oz (154.6 kg)   SpO2 95%   BMI 53.38 kg/m , BMI Body mass index is 53.38 kg/m.  Wt Readings from Last 3 Encounters:  09/14/23 (!) 340 lb 12.8 oz (154.6 kg)  07/12/23 (!) 330 lb (149.7 kg)  06/13/23 (!) 323 lb (146.5 kg)    General: Patient appears comfortable at rest. HEENT: Conjunctiva and lids normal, oropharynx clear with moist mucosa. Neck: Supple, no elevated JVP or carotid bruits, no thyromegaly. Lungs: Clear to auscultation, nonlabored breathing at rest. Cardiac: Regular rate and rhythm, no S3 or significant systolic murmur, no pericardial rub. Abdomen: Soft, nontender, no hepatomegaly, bowel sounds present, no guarding or rebound. Extremities: No pitting edema, distal pulses 2+. Skin: Warm and  dry. Musculoskeletal: No kyphosis. Neuropsychiatric: Alert and oriented x3, affect grossly appropriate.  Recent Labwork: 07/31/2023: ALT 14; AST 14; BUN 13; Creatinine, Ser 1.02; Potassium 4.2; Sodium 142; TSH 4.220     Component Value Date/Time   CHOL 119 07/31/2023 0814   TRIG 59 07/31/2023 0814   HDL 44 07/31/2023 0814   CHOLHDL 2.7 07/31/2023 0814   LDLCALC 62 07/31/2023 0814     Assessment and Plan:   HTN, poorly controlled: Home BP range between 140 and 150 mmHg SBP.  Continue p.o. Lasix 40 mg once daily, amlodipine 10 mg once daily, losartan 100  mg once daily.  Start carvedilol 6.25 mg twice daily.  She needs OSA testing, follow-up with PCP.  Echocardiogram from 2021 showed normal LVEF and no valvular heart disease.  Elevated heart rates: Occasional palpitations.  EKG today showed normal sinus rhythm, HR 98 bpm.  Will start carvedilol 25 mg twice daily for HTN and elevated heart rates.  Will follow-up on the HR in the next clinic visit.  Chest tightness: Patient does have intermittent chest tightness.  Likely secondary to poorly controlled HTN.  Will reevaluate symptoms in 3 months.      Medication Adjustments/Labs and Tests Ordered: Current medicines are reviewed at length with the patient today.  Concerns regarding medicines are outlined above.    Disposition:  Follow up  3 months  Signed Argyle Gustafson Verne Spurr, MD, 09/14/2023 3:02 PM    [facility] [address] [place], [place], [place] [address]

## 2023-09-15 ENCOUNTER — Telehealth: Payer: Self-pay | Admitting: Internal Medicine

## 2023-09-15 NOTE — Telephone Encounter (Signed)
Checking percert on the following   2 week AT  - monitor

## 2023-10-01 ENCOUNTER — Telehealth: Payer: Self-pay | Admitting: Nurse Practitioner

## 2023-10-01 NOTE — Telephone Encounter (Signed)
Pt did labs in Dec.  Do they need to be redone or can you use these?

## 2023-10-02 NOTE — Telephone Encounter (Signed)
I can use those labs from December.

## 2023-10-09 ENCOUNTER — Other Ambulatory Visit: Payer: Self-pay | Admitting: Nurse Practitioner

## 2023-10-12 ENCOUNTER — Encounter: Payer: Self-pay | Admitting: Nurse Practitioner

## 2023-10-12 ENCOUNTER — Ambulatory Visit: Payer: Medicaid Other | Admitting: Nurse Practitioner

## 2023-10-12 VITALS — BP 140/80 | HR 86 | Ht 67.0 in | Wt 350.0 lb

## 2023-10-12 DIAGNOSIS — E1165 Type 2 diabetes mellitus with hyperglycemia: Secondary | ICD-10-CM | POA: Diagnosis not present

## 2023-10-12 DIAGNOSIS — Z7984 Long term (current) use of oral hypoglycemic drugs: Secondary | ICD-10-CM | POA: Diagnosis not present

## 2023-10-12 DIAGNOSIS — Z7985 Long-term (current) use of injectable non-insulin antidiabetic drugs: Secondary | ICD-10-CM

## 2023-10-12 LAB — POCT GLYCOSYLATED HEMOGLOBIN (HGB A1C): Hemoglobin A1C: 6.8 % — AB (ref 4.0–5.6)

## 2023-10-12 MED ORDER — GLIPIZIDE ER 5 MG PO TB24: 5.0000 mg | ORAL_TABLET | Freq: Every day | ORAL | Status: AC

## 2023-10-12 MED ORDER — OZEMPIC (0.25 OR 0.5 MG/DOSE) 2 MG/3ML ~~LOC~~ SOPN: 0.5000 mg | PEN_INJECTOR | SUBCUTANEOUS | 2 refills | Status: AC

## 2023-10-12 NOTE — Progress Notes (Signed)
Endocrinology Follow Up Note       10/12/2023, 8:22 AM   Subjective:    Patient ID: Tammy Taylor, female    DOB: 1981/05/08.  Tammy Taylor is being seen in follow up after being seen in consultation for management of currently uncontrolled symptomatic diabetes requested by  Wilmon Pali, FNP.   Past Medical History:  Diagnosis Date   Diabetes mellitus without complication (HCC)    Dysrhythmia 2021   fluttering   GERD (gastroesophageal reflux disease)    History of kidney stones    Hypertension    Kidney stones    PCOS (polycystic ovarian syndrome)    Sleep apnea     Past Surgical History:  Procedure Laterality Date   CYSTOSCOPY/URETEROSCOPY/HOLMIUM LASER/STENT PLACEMENT Left 03/04/2021   Procedure: CYSTOSCOPY/RETROGRADE/URETEROSCOPY/HOLMIUM LASER/STENT PLACEMENT;  Surgeon: Jannifer Hick, MD;  Location: [facility];  Service: Urology;  Laterality: Left;    Social History   Socioeconomic History   Marital status: Single    Spouse name: Not on file   Number of children: Not on file   Years of education: Not on file   Highest education level: Not on file  Occupational History   Not on file  Tobacco Use   Smoking status: Never   Smokeless tobacco: Never  Vaping Use   Vaping status: Never Used  Substance and Sexual Activity   Alcohol use: No   Drug use: No   Sexual activity: Yes    Birth control/protection: None  Other Topics Concern   Not on file  Social History Narrative   Not on file   Social Drivers of Health   Financial Resource Strain: Low Risk  (05/08/2022)   Received from [facility]   Overall Financial Resource Strain (CARDIA)    Difficulty of Paying Living Expenses: Not very hard  Food Insecurity: No Food Insecurity (01/12/2022)   Received from [facility], [facility]   Hunger Vital Sign    Worried About Running Out of Food in the Last Year: Never true    Ran  Out of Food in the Last Year: Never true  Transportation Needs: No Transportation Needs (11/23/2021)   Received [facility], [facility]   [facility] - Transportation    Lack of Transportation (Medical): No    Lack of Transportation (Non-Medical): No  Physical Activity: Not on file  Stress: Not on file  Social Connections: Not on file    Family History  Problem Relation Age of Onset   Diabetes Father    Cancer Paternal Aunt     Outpatient Encounter Medications as of 10/12/2023  Medication Sig   amLODipine (NORVASC) 10 MG tablet Take 10 mg by mouth daily.   atorvastatin (LIPITOR) 10 MG tablet Take 10 mg by mouth daily.   carvedilol (COREG) 6.25 MG tablet Take 1 tablet (6.25 mg total) by mouth 2 (two) times daily with a meal.   FLUoxetine (PROZAC) 20 MG/5ML solution Take 10 mg by mouth daily.   furosemide (LASIX) 40 MG tablet Take 40 mg by mouth daily.   losartan (COZAAR) 100 MG tablet Take 100 mg by mouth daily.   [DISCONTINUED] glipiZIDE (  GLUCOTROL XL) 5 MG 24 hr tablet Take 2 tablets (10 mg total) by mouth daily with breakfast.   [DISCONTINUED] Semaglutide,0.25 or 0.5MG /DOS, (OZEMPIC, 0.25 OR 0.5 MG/DOSE,) 2 MG/3ML SOPN INJECT 0.5 MG INTO THE SKIN ONE TIME PER WEEK   glipiZIDE (GLUCOTROL XL) 5 MG 24 hr tablet Take 1 tablet (5 mg total) by mouth daily with breakfast.   hydrochlorothiazide (HYDRODIURIL) 12.5 MG tablet Take 12.5 mg by mouth every morning. (Patient not taking: Reported on 10/12/2023)   Semaglutide,0.25 or 0.5MG /DOS, (OZEMPIC, 0.25 OR 0.5 MG/DOSE,) 2 MG/3ML SOPN Inject 0.5 mg into the skin once a week.   No facility-administered encounter medications on file as of 10/12/2023.    ALLERGIES: Allergies  Allergen Reactions   Lisinopril Cough and Other (See Comments)    cough     VACCINATION STATUS:  There is no immunization history on file for this patient.  Diabetes She presents for her follow-up diabetic visit. She has type 2 diabetes mellitus. Onset  time: Diagnosed first with GD at age 33. Her disease course has been improving. There are no hypoglycemic associated symptoms. There are no diabetic associated symptoms. Pertinent negatives for diabetes include no weight loss. There are no hypoglycemic complications. Symptoms are stable. There are no diabetic complications. Risk factors for coronary artery disease include diabetes mellitus, dyslipidemia, family history, obesity, hypertension and sedentary lifestyle. Current diabetic treatment includes oral agent (monotherapy) (and Ozempic). She is compliant with treatment most of the time. Her weight is increasing steadily. She is following a generally unhealthy diet. When asked about meal planning, she reported none. She has not had a previous visit with a dietitian. She participates in exercise intermittently. Her home blood glucose trend is decreasing steadily. Her breakfast blood glucose range is generally 110-130 mg/dl. Her bedtime blood glucose range is generally 110-130 mg/dl. Her overall blood glucose range is 110-130 mg/dl. (She presents today with her logs showing at goal glycemic profile.  Her POCT A1c today is 6.8%, improving drastically from last A1c of 12.9%.  She has tolerated the Ozempic well, denies any significant hypoglycemia.) An ACE inhibitor/angiotensin II receptor blocker is being taken. She does not see a podiatrist.Eye exam is current.     Review of systems  Constitutional: + increasing body weight, current Body mass index is 54.82 kg/m., no fatigue, no subjective hyperthermia, no subjective hypothermia Eyes: no blurry vision, no xerophthalmia ENT: no sore throat, no nodules palpated in throat, no dysphagia/odynophagia, no hoarseness Cardiovascular: no chest pain, no shortness of breath, no palpitations, no leg swelling Respiratory: no cough, no shortness of breath Gastrointestinal: no nausea/vomiting/diarrhea Musculoskeletal: no muscle/joint aches Skin: no rashes, no  hyperemia Neurological: no tremors, no numbness, no tingling, no dizziness Psychiatric: no depression, no anxiety  Objective:     BP (!) 140/80 (BP Location: Left Arm, Patient Position: Sitting, Cuff Size: Large)   Pulse 86   Ht 5\' 7"  (1.702 m)   Wt (!) 350 lb (158.8 kg)   BMI 54.82 kg/m   Wt Readings from Last 3 Encounters:  10/12/23 (!) 350 lb (158.8 kg)  09/14/23 (!) 340 lb 12.8 oz (154.6 kg)  07/12/23 (!) 330 lb (149.7 kg)     BP Readings from Last 3 Encounters:  10/12/23 (!) 140/80  09/14/23 (!) 160/102  07/12/23 (!) 150/90      Physical Exam- Limited  Constitutional:  Body mass index is 54.82 kg/m. , not in acute distress, normal state of mind Eyes:  EOMI, no exophthalmos Musculoskeletal: no gross deformities,  strength intact in all four extremities, no gross restriction of joint movements Skin:  no rashes, no hyperemia Neurological: no tremor with outstretched hands   Diabetic Foot Exam - Simple   No data filed      CMP ( most recent) CMP     Component Value Date/Time   NA 142 07/31/2023 0814   K 4.2 07/31/2023 0814   CL 102 07/31/2023 0814   CO2 23 07/31/2023 0814   GLUCOSE 139 (H) 07/31/2023 0814   GLUCOSE 147 (H) 03/04/2021 0632   BUN 13 07/31/2023 0814   CREATININE 1.02 (H) 07/31/2023 0814   CALCIUM 9.4 07/31/2023 0814   PROT 7.0 07/31/2023 0814   ALBUMIN 4.1 07/31/2023 0814   AST 14 07/31/2023 0814   ALT 14 07/31/2023 0814   ALKPHOS 80 07/31/2023 0814   BILITOT 0.4 07/31/2023 0814   EGFR 70 07/31/2023 0814   GFRNONAA >60 03/04/2021 0632     Diabetic Labs (most recent): Lab Results  Component Value Date   HGBA1C 6.8 (A) 10/12/2023   HGBA1C 12.9 03/13/2023   HGBA1C 7.5 (H) 02/21/2021   MICROALBUR 150 mg/L 07/12/2023     Lipid Panel ( most recent) Lipid Panel     Component Value Date/Time   CHOL 119 07/31/2023 0814   TRIG 59 07/31/2023 0814   HDL 44 07/31/2023 0814   CHOLHDL 2.7 07/31/2023 0814   LDLCALC 62 07/31/2023 0814    LABVLDL 13 07/31/2023 0814      Lab Results  Component Value Date   TSH 4.220 07/31/2023   FREET4 1.19 07/31/2023           Assessment & Plan:   1) Type 2 diabetes mellitus with hyperglycemia, without long-term current use of insulin (HCC)  She presents today with her logs showing at goal glycemic profile.  Her POCT A1c today is 6.8%, improving drastically from last A1c of 12.9%.  She has tolerated the Ozempic well, denies any significant hypoglycemia.  Tammy Taylor has currently uncontrolled symptomatic type 2 DM since 43 years of age.   -Recent labs reviewed.  Her POCT UM shows mild microalbuminuria.  Will recheck CMP prior to next visit.  - I had a long discussion with her about the progressive nature of diabetes and the pathology behind its complications. -her diabetes is not currently complicated but she remains at a high risk for more acute and chronic complications which include CAD, CVA, CKD, retinopathy, and neuropathy. These are all discussed in detail with her.  The following Lifestyle Medicine recommendations according to American College of Lifestyle Medicine [facility]) were discussed and offered to patient and she agrees to start the journey:  A. Whole Foods, Plant-based plate comprising of fruits and vegetables, plant-based proteins, whole-grain carbohydrates was discussed in detail with the patient.   A list for source of those nutrients were also provided to the patient.  Patient will use only water or unsweetened tea for hydration. B.  The need to stay away from risky substances including alcohol, smoking; obtaining 7 to 9 hours of restorative sleep, at least 150 minutes of moderate intensity exercise weekly, the importance of healthy social connections,  and stress reduction techniques were discussed. C.  A full color page of  Calorie density of various food groups per pound showing examples of each food groups was provided to the patient.  - Nutritional  counseling repeated at each appointment due to patients tendency to fall back in to old habits.  - The patient admits  there is a room for improvement in their diet and drink choices. -  Suggestion is made for the patient to avoid simple carbohydrates from their diet including Cakes, Sweet Desserts / Pastries, Ice Cream, Soda (diet and regular), Sweet Tea, Candies, Chips, Cookies, Sweet Pastries, Store Bought Juices, Alcohol in Excess of 1-2 drinks a day, Artificial Sweeteners, Coffee Creamer, and "Sugar-free" Products. This will help patient to have stable blood glucose profile and potentially avoid unintended weight gain.   - I encouraged the patient to switch to unprocessed or minimally processed complex starch and increased protein intake (animal or plant source), fruits, and vegetables.   - Patient is advised to stick to a routine mealtimes to eat 3 meals a day and avoid unnecessary snacks (to snack only to correct hypoglycemia).  - I have approached her with the following individualized plan to manage her diabetes and patient agrees:   She is advised to lower her Glipizide to 5 mg XL daily with breakfast and continue her Ozempic 0.5 mg SQ weekly (Taylor increase on next visit).   -she is encouraged to continue monitoring glucose 2 times daily, before breakfast and before bed, and to call the clinic if she has readings less than 70 or above 300 for 3 tests in a row.  - Adjustment parameters are given to her for hypo and hyperglycemia in writing.  - her Jardiance as previously discontinued, risk outweighs benefit for this patient- she reports significant problem with yeast infections with this medication.  She does not have history of pancreatitis, does not smoke, no personal or family history of MTC, therefore she is an excellent candidate.  - Specific targets for  A1c; LDL, HDL, and Triglycerides were discussed with the patient.  2) Blood Pressure /Hypertension:  her blood pressure is NOT  controlled to target but she is following with cardiology now.     3) Lipids/Hyperlipidemia:    Recent lipid panel from 07/31/23 shows controlled LDL of 62.  she is advised to continue Lipitor 10 mg daily at bedtime.  Side effects and precautions discussed with her.    4)  Weight/Diet:  her Body mass index is 54.82 kg/m.  -  clearly complicating her diabetes care.   she is a candidate for weight loss. I discussed with her the fact that loss of 5 - 10% of her  current body weight will have the most impact on her diabetes management.  Exercise, and detailed carbohydrates information provided  -  detailed on discharge instructions.  5) Chronic Care/Health Maintenance: -she is on ACEI/ARB and Statin medications and is encouraged to initiate and continue to follow up with Ophthalmology, Dentist, Podiatrist at least yearly or according to recommendations, and advised to stay away from smoking. I have recommended yearly flu vaccine and pneumonia vaccine at least every 5 years; moderate intensity exercise for up to 150 minutes weekly; and sleep for at least 7 hours a day.  - she is advised to maintain close follow up with Wilmon Pali, FNP for primary care needs, as well as her other providers for optimal and coordinated care.     I spent  28  minutes in the care of the patient today including review of labs from CMP, Lipids, Thyroid Function, Hematology (current and previous including abstractions from other facilities); face-to-face time discussing  her blood glucose readings/logs, discussing hypoglycemia and hyperglycemia episodes and symptoms, medications doses, her options of short and long term treatment based on the latest standards of care /  guidelines;  discussion about incorporating lifestyle medicine;  and documenting the encounter. Risk reduction counseling performed per USPSTF guidelines to reduce obesity and cardiovascular risk factors.     Please refer to Patient Instructions for Blood  Glucose Monitoring and Insulin/Medications Dosing Guide"  in media tab for additional information. Please  also refer to " Patient Self Inventory" in the Media  tab for reviewed elements of pertinent patient history.  Tammy Taylor participated in the discussions, expressed understanding, and voiced agreement with the above plans.  All questions were answered to her satisfaction. she is encouraged to contact clinic should she have any questions or concerns prior to her return visit.     Follow up plan: - Return in about 4 months (around 02/09/2024) for Diabetes F/U with A1c in office, No previsit labs, Bring meter and logs.   Ronny Bacon, [facility] [address] [place], [place] [address] Phone: [phone] Fax: [phone]  10/12/2023, 8:22 AM

## 2023-11-21 ENCOUNTER — Telehealth: Payer: Self-pay

## 2023-11-21 NOTE — Telephone Encounter (Signed)
-----   Message from Vishnu P Mallipeddi sent at 11/16/2023  2:39 PM EDT ----- Average HR 88 bpm, no malignant arrhythmias or conduction abnormal days.  Symptoms correlated with NSR, 86 to 99 bpm.  Continue current medications.

## 2023-11-21 NOTE — Telephone Encounter (Signed)
 The patient has been notified of the result and verbalized understanding.  All questions (if any) were answered. Carmelina Paddock, [place] 11/21/2023 4:27 PM

## 2023-12-17 ENCOUNTER — Ambulatory Visit: Payer: Medicaid Other | Attending: Internal Medicine | Admitting: Internal Medicine

## 2023-12-17 ENCOUNTER — Telehealth: Payer: Self-pay

## 2023-12-17 ENCOUNTER — Encounter: Payer: Self-pay | Admitting: Internal Medicine

## 2023-12-17 VITALS — BP 162/114 | HR 92 | Ht 67.0 in | Wt 347.6 lb

## 2023-12-17 DIAGNOSIS — N183 Chronic kidney disease, stage 3 unspecified: Secondary | ICD-10-CM | POA: Diagnosis not present

## 2023-12-17 DIAGNOSIS — I10 Essential (primary) hypertension: Secondary | ICD-10-CM

## 2023-12-17 DIAGNOSIS — E119 Type 2 diabetes mellitus without complications: Secondary | ICD-10-CM | POA: Diagnosis not present

## 2023-12-17 DIAGNOSIS — E1322 Other specified diabetes mellitus with diabetic chronic kidney disease: Secondary | ICD-10-CM

## 2023-12-17 MED ORDER — CARVEDILOL 12.5 MG PO TABS: 12.5000 mg | ORAL_TABLET | Freq: Two times a day (BID) | ORAL | 0 refills | Status: DC

## 2023-12-17 NOTE — Progress Notes (Signed)
 Cardiology Office Note  Date: 12/17/2023   ID: Tammy Taylor, DOB [birth year], MRN [id number]  PCP:  Joanne Muckle, FNP  Cardiologist:  Lasalle Pointer, MD Electrophysiologist:  None   History of Present Illness: Tammy Taylor is a 43 y.o. female known to have HTN is here for follow-up visit.  Home BP ranges around 140 mmHg.  Improved compared to before.  Currently on amlodipine 10 mg once daily, carvedilol  6.25 mg twice daily, losartan 100 mg once daily and p.o. Lasix 40 mg once daily.  Currently has viral infection, cold and thinks the decongestants she is using might be causing elevated blood pressure.  No symptoms, no chest pain, SOB, dizziness, palpitations, syncope or leg swelling.  Past Medical History:  Diagnosis Date   Diabetes mellitus without complication (HCC)    Dysrhythmia 2021   fluttering   GERD (gastroesophageal reflux disease)    History of kidney stones    Hypertension    Kidney stones    PCOS (polycystic ovarian syndrome)    Sleep apnea     Past Surgical History:  Procedure Laterality Date   CYSTOSCOPY/URETEROSCOPY/HOLMIUM LASER/STENT PLACEMENT Left 03/04/2021   Procedure: CYSTOSCOPY/RETROGRADE/URETEROSCOPY/HOLMIUM LASER/STENT PLACEMENT;  Surgeon: Tammy Pigg, MD;  Location: [facility];  Service: Urology;  Laterality: Left;    Current Outpatient Medications  Medication Sig Dispense Refill   VENTOLIN  HFA 108 (90 Base) MCG/ACT inhaler SMARTSIG:2 Puff(s) Via Inhaler PRN     amLODipine (NORVASC) 10 MG tablet Take 10 mg by mouth daily.     atorvastatin (LIPITOR) 10 MG tablet Take 10 mg by mouth daily.     carvedilol  (COREG ) 6.25 MG tablet Take 1 tablet (6.25 mg total) by mouth 2 (two) times daily with a meal. 60 tablet 3   FLUoxetine (PROZAC) 20 MG/5ML solution Take 10 mg by mouth daily.     furosemide (LASIX) 40 MG tablet Take 40 mg by mouth daily.     glipiZIDE  (GLUCOTROL  XL) 5 MG 24 hr tablet Take 1 tablet (5 mg total) by mouth daily with  breakfast.     losartan (COZAAR) 100 MG tablet Take 100 mg by mouth daily.     Semaglutide ,0.25 or 0.5MG /DOS, (OZEMPIC , 0.25 OR 0.5 MG/DOSE,) 2 MG/3ML SOPN Inject 0.5 mg into the skin once a week. 3 mL 2   No current facility-administered medications for this visit.   Allergies:  Lisinopril   Social History: The patient  reports that she has never smoked. She has never used smokeless tobacco. She reports that she does not drink alcohol and does not use drugs.   Family History: The patient's family history includes Cancer in her paternal aunt; Diabetes in her father.   ROS:  Please see the history of present illness. Otherwise, complete review of systems is positive for none  All other systems are reviewed and negative.   Physical Exam: VS:  Ht 5\' 7"  (1.702 m)   Wt (!) 347 lb 9.6 oz (157.7 kg)   BMI 54.44 kg/m , BMI Body mass index is 54.44 kg/m.  Wt Readings from Last 3 Encounters:  12/17/23 (!) 347 lb 9.6 oz (157.7 kg)  10/12/23 (!) 350 lb (158.8 kg)  09/14/23 (!) 340 lb 12.8 oz (154.6 kg)    General: Patient appears comfortable at rest. HEENT: Conjunctiva and lids normal, oropharynx clear with moist mucosa. Neck: Supple, no elevated JVP or carotid bruits, no thyromegaly. Lungs: Clear to auscultation, nonlabored breathing at rest. Cardiac: Regular rate and rhythm, no  S3 or significant systolic murmur, no pericardial rub. Abdomen: Soft, nontender, no hepatomegaly, bowel sounds present, no guarding or rebound. Extremities: No pitting edema, distal pulses 2+. Skin: Warm and dry. Musculoskeletal: No kyphosis. Neuropsychiatric: Alert and oriented x3, affect grossly appropriate.  Recent Labwork: 07/31/2023: ALT 14; AST 14; BUN 13; Creatinine, Ser 1.02; Potassium 4.2; Sodium 142; TSH 4.220     Component Value Date/Time   CHOL 119 07/31/2023 0814   TRIG 59 07/31/2023 0814   HDL 44 07/31/2023 0814   CHOLHDL 2.7 07/31/2023 0814   LDLCALC 62 07/31/2023 0814     Assessment and  Plan:   HTN, poorly controlled: Home BP ranges around 140 mmHg.  Goal BP less than 130/80 mmHg.  Will increase carvedilol  from 6.25 mg to 12.5 mg twice daily.  She is instructed to check her blood pressures for the next 1 month and if they continue to be elevated, more than 130 mmHg SBP and HR more than 70 bpm, she will benefit from increasing the dose of carvedilol  from 12.5 mg to 25 mg twice daily.  She will call the clinic for the new prescription.  Continue remaining antihypertensives, amlodipine 10 mg once daily, losartan 100 mg once daily and p.o. Lasix 40 mg once daily.  Echocardiogram in 2021 showed normal LVEF and no valvular heart disease.  Follow-up with PCP for OSA evaluation.  Elevated heart rates: Occasional palpitations.  Event monitor from February 2025 showed no malignant arrhythmias or conduction issues.  Average HR 88 bpm.  Will monitor.  No further workup.  Chest tightness, resolved: No further workup.  DM2: On glipizide  and Ozempic .  Patient asked for referral to dietitian.      Medication Adjustments/Labs and Tests Ordered: Current medicines are reviewed at length with the patient today.  Concerns regarding medicines are outlined above.    Disposition:  Follow up 6 months  Signed Newell Frater Priya Tin Engram, MD, 12/17/2023 9:03 AM    [facility] at [facility] [address] [place], [place], [place] [address]

## 2023-12-17 NOTE — Patient Instructions (Addendum)
 Medication Instructions:  Your physician has recommended you make the following change in your medication:  Increase Coreg  from 6.25 mg to 12.5 mg twice daily for one month Continue taking all other medications as prescribed   Labwork: None  Testing/Procedures: Your physician has requested that you have an echocardiogram. Echocardiography is a painless test that uses sound waves to create images of your heart. It provides your doctor with information about the size and shape of your heart and how well your heart's chambers and valves are working. This procedure takes approximately one hour. There are no restrictions for this procedure. Please do NOT wear cologne, perfume, aftershave, or lotions (deodorant is allowed). Please arrive 15 minutes prior to your appointment time.  Please note: We ask at that you not bring children with you during ultrasound (echo/ vascular) testing. Due to room size and safety concerns, children are not allowed in the ultrasound rooms during exams. Our front office staff cannot provide observation of children in our lobby area while testing is being conducted. An adult accompanying a patient to their appointment will only be allowed in the ultrasound room at the discretion of the ultrasound technician under special circumstances. We apologize for any inconvenience.   Follow-Up: Your physician recommends that you schedule a follow-up appointment in: 6 months  Any Other Special Instructions Will Be Listed Below (If Applicable).  Check blood pressures daily. If Blood pressure is higher than 130 and HR higher than 70 please call office as to we will increase your dosage of Coreg .  Referral to Dietician   Thank you for choosing [facility]!     If you need a refill on your cardiac medications before your next appointment, please call your pharmacy.

## 2023-12-17 NOTE — Telephone Encounter (Signed)
 Dr. Mallipeddi sent after patient left:  We do not need Echo on this one. She already had one in 2021.  I canceled it. Called patient and is aware and verbalized understanding.

## 2024-01-10 ENCOUNTER — Ambulatory Visit: Admitting: Nutrition

## 2024-01-29 ENCOUNTER — Telehealth: Payer: Self-pay | Admitting: Nutrition

## 2024-01-29 NOTE — Telephone Encounter (Signed)
 TC to see if she is keeping her appt tomorrow or needed to r/s

## 2024-01-30 ENCOUNTER — Ambulatory Visit: Admitting: Nutrition

## 2024-02-11 ENCOUNTER — Ambulatory Visit: Payer: Medicaid Other | Admitting: Nurse Practitioner

## 2024-02-11 DIAGNOSIS — E782 Mixed hyperlipidemia: Secondary | ICD-10-CM

## 2024-02-11 DIAGNOSIS — E1165 Type 2 diabetes mellitus with hyperglycemia: Secondary | ICD-10-CM

## 2024-02-11 DIAGNOSIS — I10 Essential (primary) hypertension: Secondary | ICD-10-CM

## 2024-02-11 DIAGNOSIS — Z7984 Long term (current) use of oral hypoglycemic drugs: Secondary | ICD-10-CM

## 2024-02-11 DIAGNOSIS — Z7985 Long-term (current) use of injectable non-insulin antidiabetic drugs: Secondary | ICD-10-CM

## 2024-02-20 ENCOUNTER — Telehealth: Payer: Self-pay | Admitting: Internal Medicine

## 2024-02-20 NOTE — Telephone Encounter (Signed)
 Pt c/o medication issue:  1. Name of Medication:   carvedilol  (COREG ) 12.5 MG tablet    2. How are you currently taking this medication (dosage and times per day)?  Take 1 tablet (12.5 mg total) by mouth 2 (two) times daily with a meal.      3. Are you having a reaction (difficulty breathing--STAT)? No  4. What is your medication issue? Pt is requesting a callback regarding her wanting to discuss and increase dosage for the medication. Please advise

## 2024-02-20 NOTE — Telephone Encounter (Signed)
 Patient calling to giver her update on BP and HR. She checked her BP while on the phone with me. BP 163/90 HR 79. She stated that she stated that the lowest her HR has been is 79 and ranges between 89-90. Calling to see if she is going to increase dose or leave the same. Advised her will route over to provider.

## 2024-02-22 MED ORDER — CARVEDILOL 25 MG PO TABS: 25.0000 mg | ORAL_TABLET | Freq: Two times a day (BID) | ORAL | 5 refills | Status: AC

## 2024-02-22 NOTE — Telephone Encounter (Signed)
 Per Dr. Mallipeddi:  Increase Coreg  from 12.5 mg to 25 mg BID.   Left detailed message per DPR that increased medications has been sent into her pharmacy.

## 2024-08-07 ENCOUNTER — Ambulatory Visit: Admitting: Dermatology

## 2025-01-28 ENCOUNTER — Ambulatory Visit: Admitting: Physician Assistant
